# Patient Record
Sex: Male | Born: 1965 | Hispanic: Yes | Marital: Married | Smoking: Never smoker
Health system: Southern US, Community
[De-identification: ages and names within clinical notes are randomized; demographics above are authoritative.]

## PROBLEM LIST (undated history)

## (undated) DIAGNOSIS — A549 Gonococcal infection, unspecified: Secondary | ICD-10-CM

## (undated) DIAGNOSIS — N289 Disorder of kidney and ureter, unspecified: Secondary | ICD-10-CM

## (undated) DIAGNOSIS — E119 Type 2 diabetes mellitus without complications: Secondary | ICD-10-CM

## (undated) DIAGNOSIS — I10 Essential (primary) hypertension: Secondary | ICD-10-CM

## (undated) DIAGNOSIS — D6851 Activated protein C resistance: Secondary | ICD-10-CM

## (undated) DIAGNOSIS — D689 Coagulation defect, unspecified: Secondary | ICD-10-CM

## (undated) DIAGNOSIS — A6920 Lyme disease, unspecified: Secondary | ICD-10-CM

## (undated) DIAGNOSIS — E079 Disorder of thyroid, unspecified: Secondary | ICD-10-CM

## (undated) DIAGNOSIS — K219 Gastro-esophageal reflux disease without esophagitis: Secondary | ICD-10-CM

## (undated) DIAGNOSIS — I82409 Acute embolism and thrombosis of unspecified deep veins of unspecified lower extremity: Secondary | ICD-10-CM

## (undated) HISTORY — DX: Gastro-esophageal reflux disease without esophagitis: K21.9

## (undated) HISTORY — PX: CIRCUMCISION: SUR203

## (undated) HISTORY — DX: Essential (primary) hypertension: I10

## (undated) HISTORY — PX: TUNNELED VENOUS CATHETER PLACEMENT: SHX818

---

## 2010-06-17 ENCOUNTER — Other Ambulatory Visit: Payer: Self-pay | Admitting: Oncology

## 2010-06-17 ENCOUNTER — Encounter (HOSPITAL_BASED_OUTPATIENT_CLINIC_OR_DEPARTMENT_OTHER): Payer: BC Managed Care – PPO | Admitting: Oncology

## 2010-06-17 DIAGNOSIS — I749 Embolism and thrombosis of unspecified artery: Secondary | ICD-10-CM

## 2010-06-17 LAB — CBC WITH DIFFERENTIAL/PLATELET
BASO%: 1.2 % (ref 0.0–2.0)
HCT: 51.6 % — ABNORMAL HIGH (ref 38.4–49.9)
MCHC: 36 g/dL (ref 32.0–36.0)
MONO#: 0.7 10*3/uL (ref 0.1–0.9)
NEUT%: 51.6 % (ref 39.0–75.0)
RBC: 5.71 10*6/uL (ref 4.20–5.82)
WBC: 6.6 10*3/uL (ref 4.0–10.3)
lymph#: 2.1 10*3/uL (ref 0.9–3.3)

## 2010-06-21 LAB — HYPERCOAGULABLE PANEL, COMPREHENSIVE
AntiThromb III Func: 92 % (ref 76–126)
Anticardiolipin IgA: 6 APL U/mL (ref <22)
Anticardiolipin IgG: 19 GPL U/mL (ref <23)
Beta-2-Glycoprotein I IgA: 3 A Units (ref <20)
DRVVT: 70.2 secs — ABNORMAL HIGH (ref 36.2–44.3)
Lupus Anticoagulant: NOT DETECTED
PTTLA 4:1 Mix: 39.1 secs (ref 30.0–45.6)
Protein C Activity: 20 % — ABNORMAL LOW (ref 75–133)
Protein S Total: 59 % — ABNORMAL LOW (ref 60–150)

## 2010-06-21 LAB — COMPREHENSIVE METABOLIC PANEL
ALT: 35 U/L (ref 0–53)
CO2: 22 mEq/L (ref 19–32)
Calcium: 9.6 mg/dL (ref 8.4–10.5)
Chloride: 103 mEq/L (ref 96–112)
Creatinine, Ser: 1.48 mg/dL (ref 0.40–1.50)

## 2010-06-21 LAB — JAK-2 V617F

## 2010-11-06 ENCOUNTER — Encounter: Payer: Self-pay | Admitting: *Deleted

## 2010-11-06 ENCOUNTER — Emergency Department (HOSPITAL_BASED_OUTPATIENT_CLINIC_OR_DEPARTMENT_OTHER)
Admission: EM | Admit: 2010-11-06 | Discharge: 2010-11-06 | Disposition: A | Discharge status: Home / Self Care | Payer: BC Managed Care – PPO | Attending: Emergency Medicine | Admitting: Emergency Medicine

## 2010-11-06 ENCOUNTER — Emergency Department (INDEPENDENT_AMBULATORY_CARE_PROVIDER_SITE_OTHER): Payer: BC Managed Care – PPO

## 2010-11-06 DIAGNOSIS — Z79899 Other long term (current) drug therapy: Secondary | ICD-10-CM | POA: Insufficient documentation

## 2010-11-06 DIAGNOSIS — X58XXXA Exposure to other specified factors, initial encounter: Secondary | ICD-10-CM

## 2010-11-06 DIAGNOSIS — M7580 Other shoulder lesions, unspecified shoulder: Secondary | ICD-10-CM

## 2010-11-06 DIAGNOSIS — M67919 Unspecified disorder of synovium and tendon, unspecified shoulder: Secondary | ICD-10-CM | POA: Insufficient documentation

## 2010-11-06 DIAGNOSIS — M719 Bursopathy, unspecified: Secondary | ICD-10-CM | POA: Insufficient documentation

## 2010-11-06 DIAGNOSIS — S4980XA Other specified injuries of shoulder and upper arm, unspecified arm, initial encounter: Secondary | ICD-10-CM

## 2010-11-06 HISTORY — DX: Activated protein C resistance: D68.51

## 2010-11-06 HISTORY — DX: Lyme disease, unspecified: A69.20

## 2010-11-06 NOTE — ED Provider Notes (Signed)
Scribed for Geoffery Lyons, MD, the patient was seen in room MH03/MH03 . This chart was scribed by Ellie Lunch. This patient's care was started at 3:50 PM.   CSN: [id number] Arrival date & time: 11/06/2010  3:43 PM  Chief Complaint  Patient presents with  . Shoulder Injury    (Consider location/radiation/quality/duration/timing/severity/associated sxs/prior treatment) HPI Shane Brown is a 45 y.o. male who presents to the Emergency Department complaining of left shoulder pain. Pt reports injuring his left shoulder a month ago using a weight machine while working out. Pt reports limited ROM on his left shoulder. Reports aggravation of pain with external rotation of the shoulder and abduction. Pt further aggravated shoulder yesterday while walking dog when his dog pulled on the leash. Reports pain at 8/10 in severity. Pt reports normal grip strength and denies tingling sensation in his left digits. Denies h/o shoulder problems.   Past Medical History  Diagnosis Date  . Factor 5 Leiden mutation, heterozygous   . Lyme disease     Past Surgical History  Procedure Date  . Tunneled venous catheter placement   . Circumcision     History reviewed. No pertinent family history.  History  Substance Use Topics  . Smoking status: Never Smoker   . Smokeless tobacco: Not on file  . Alcohol Use: Yes    Review of Systems 10 Systems reviewed and are negative for acute change except as noted in the HPI.   Allergies  Iodides and Contrast media  Home Medications   Current Outpatient Rx  Name Route Sig Dispense Refill  . ASPIRIN 81 MG PO TABS Oral Take 81 mg by mouth daily.      . AZELAIC ACID 15 % EX GEL Topical Apply 1 application topically daily.      . FEBUXOSTAT 80 MG PO TABS Oral Take 1 tablet by mouth daily.      Marland Kitchen FEXOFENADINE HCL 180 MG PO TABS Oral Take 180 mg by mouth daily.      . MULTI-VITAMIN/MINERALS PO TABS Oral Take 1 tablet by mouth daily.      Marland Kitchen NAPROXEN SODIUM 220 MG  PO TABS Oral Take 220 mg by mouth once as needed. For pain     . TESTOSTERONE CYPIONATE 100 MG/ML IM OIL Intramuscular Inject 200 mg into the muscle once a week.      . THYROID 60 MG PO TABS Oral Take 60 mg by mouth daily.      Marland Kitchen VALSARTAN 80 MG PO TABS Oral Take 80 mg by mouth daily. For protein clearance     . VITAMIN D (ERGOCALCIFEROL) PO Oral Take 1 tablet by mouth daily.      Marland Kitchen ZOLPIDEM TARTRATE 10 MG PO TABS Oral Take 10 mg by mouth at bedtime as needed. For sleep      . CYANOCOBALAMIN 1000 MCG/ML IJ SOLN Intramuscular Inject 1,000 mcg into the muscle every 30 (thirty) days.        BP 159/97  Pulse 90  Temp(Src) 98 F (36.7 C) (Oral)  Resp 18  Ht 6' (1.829 m)  Wt 220 lb (99.791 kg)  BMI 29.84 kg/m2  SpO2 97%  Physical Exam  Nursing note and vitals reviewed. Constitutional: He is oriented to person, place, and time. He appears well-developed and well-nourished.  HENT:  Head: Normocephalic and atraumatic.  Eyes: Conjunctivae and EOM are normal.  Neck: Normal range of motion.  Cardiovascular: Normal rate, regular rhythm and normal heart sounds.   Pulmonary/Chest: Effort normal and breath  sounds normal.  Abdominal: Soft. There is no tenderness.  Musculoskeletal:       Tenderness lateral left shoulder. Neurovascularly intact. Distal extremities ok. Pain on abduction and external rotation.   Neurological: He is alert and oriented to person, place, and time.  Skin: Skin is warm and dry.  Psychiatric: He has a normal mood and affect.    ED Course  Procedures (including critical care time)  OTHER DATA REVIEWED: Nursing notes, vital signs, and past medical records reviewed.  LABS Dg Shoulder Left  11/06/2010  *RADIOLOGY REPORT*  Clinical Data: Injury to left shoulder  LEFT SHOULDER - 2+ VIEW  Comparison: None  Findings: There is no evidence of fracture or dislocation.  There is no evidence of arthropathy or other focal bone abnormality. Soft tissues are unremarkable.   IMPRESSION: Negative exam.  Original Report Authenticated By: Rosealee Albee, M.D.    DIAGNOSTIC STUDIES: Oxygen Saturation is 97% on room air, normal by my interpretation.    ED COURSE / COORDINATION OF CARE: 15:54 EDP at PT bedside. Discussed diagnostic possibilities including rotator cuff tendonitis. Discussed treatment plan including rest, antiinflammatories and time. If no improvement, f/u with PCP for possible MRI. Plan to discharge.   MDM: Xrays okay, appears to be rotator cuff injury.   SCRIBE ATTESTATION: I personally performed the services described in this documentation, which was scribed in my presence. The recorded information has been reviewed and considered.          3  Geoffery Lyons, MD 11/06/10 2047

## 2010-11-06 NOTE — ED Notes (Signed)
Pt states he originally ?injured his left shoulder working out. Did not get evaluated. Yesterday was walking dog and she jerked his left arm again. Now c/o pain to same.

## 2010-12-14 ENCOUNTER — Encounter (HOSPITAL_BASED_OUTPATIENT_CLINIC_OR_DEPARTMENT_OTHER): Payer: Self-pay

## 2010-12-14 ENCOUNTER — Emergency Department (HOSPITAL_BASED_OUTPATIENT_CLINIC_OR_DEPARTMENT_OTHER)
Admission: EM | Admit: 2010-12-14 | Discharge: 2010-12-14 | Disposition: A | Discharge status: Home / Self Care | Payer: BC Managed Care – PPO | Attending: Emergency Medicine | Admitting: Emergency Medicine

## 2010-12-14 DIAGNOSIS — R059 Cough, unspecified: Secondary | ICD-10-CM | POA: Insufficient documentation

## 2010-12-14 DIAGNOSIS — J3489 Other specified disorders of nose and nasal sinuses: Secondary | ICD-10-CM | POA: Insufficient documentation

## 2010-12-14 DIAGNOSIS — H9209 Otalgia, unspecified ear: Secondary | ICD-10-CM | POA: Insufficient documentation

## 2010-12-14 DIAGNOSIS — Z79899 Other long term (current) drug therapy: Secondary | ICD-10-CM | POA: Insufficient documentation

## 2010-12-14 DIAGNOSIS — R05 Cough: Secondary | ICD-10-CM | POA: Insufficient documentation

## 2010-12-14 DIAGNOSIS — E079 Disorder of thyroid, unspecified: Secondary | ICD-10-CM | POA: Insufficient documentation

## 2010-12-14 HISTORY — DX: Disorder of thyroid, unspecified: E07.9

## 2010-12-14 HISTORY — DX: Disorder of kidney and ureter, unspecified: N28.9

## 2010-12-14 HISTORY — DX: Coagulation defect, unspecified: D68.9

## 2010-12-14 MED ORDER — BENZONATATE 200 MG PO CAPS: 200.0000 mg | ORAL_CAPSULE | Freq: Three times a day (TID) | ORAL | Status: AC | PRN

## 2010-12-14 MED ORDER — BENZONATATE 100 MG PO CAPS
200.0000 mg | ORAL_CAPSULE | Freq: Once | ORAL | Status: AC
  Administered 2010-12-14: 200 mg via ORAL
  Filled 2010-12-14: qty 2

## 2010-12-14 MED ORDER — HYDROCOD POLST-CHLORPHEN POLST 10-8 MG/5ML PO LQCR: 5.0000 mL | Freq: Every evening | ORAL | Status: DC | PRN

## 2010-12-14 NOTE — ED Provider Notes (Signed)
History     CSN: [id number] Arrival date & time: 12/14/2010  8:52 AM   First MD Initiated Contact with Patient 12/14/10 [phone]      Chief Complaint  Patient presents with  . Cough  . Nasal Congestion  . 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  He says his pain is a 5/10.  He has non-productive cough and denies fevers, nausea, or vomiting.  He states that he has tried OTC meds without relief.  Nothing makes the cough better and it is worse laying down at night.  Patient has no radiation of his pain and says that his ear pain is throbbing.  He is mainly concerned about his cough today.  There are no other associated or modifying factors.  Past Medical History  Diagnosis Date  . Factor 5 Leiden mutation, heterozygous   . Lyme disease   . Clotting disorder   . Thyroid disease   . Renal disorder     Past Surgical History  Procedure Date  . Tunneled venous catheter placement   . Circumcision     History reviewed. No pertinent family history.  History  Substance Use Topics  . Smoking status: Never Smoker   . Smokeless tobacco: Not on file  . Alcohol Use: Yes     occasiobnal      Review of Systems  Constitutional: Negative.   HENT: Positive for ear pain, congestion, rhinorrhea and postnasal drip.   Eyes: Negative.   Respiratory: Positive for cough.   Cardiovascular: Negative.   Gastrointestinal: Negative.   Genitourinary: Negative.   Musculoskeletal: Negative.   Skin: Negative.   Neurological: Negative.   Hematological: Negative.   Psychiatric/Behavioral: Negative.   All other systems reviewed and are negative.    Allergies  Iodides and Contrast media  Home Medications   Current Outpatient Rx  Name Route Sig Dispense Refill  . ALPRAZOLAM 1 MG PO TABS Oral  Take 1 mg by mouth daily.      . ASPIRIN 81 MG PO TABS Oral Take 81 mg by mouth daily.      . AZELAIC ACID 15 % EX GEL Topical Apply 1 application topically daily.      Marland Kitchen BENZONATATE 200 MG PO CAPS Oral Take 1 capsule (200 mg total) by mouth 3 (three) times daily as needed for cough. 20 capsule 0  . HYDROCOD POLST-CHLORPHEN POLST 10-8 MG/5ML PO LQCR Oral Take 5 mLs by mouth at bedtime as needed. 140 mL 0  . CYANOCOBALAMIN 1000 MCG/ML IJ SOLN Intramuscular Inject 1,000 mcg into the muscle every 30 (thirty) days.      . FEBUXOSTAT 80 MG PO TABS Oral Take 1 tablet by mouth daily.      Marland Kitchen FEXOFENADINE HCL 180 MG PO TABS Oral Take 180 mg by mouth daily.      . MULTI-VITAMIN/MINERALS PO TABS Oral Take 1 tablet by mouth daily.      Marland Kitchen NAPROXEN SODIUM 220 MG PO TABS Oral Take 220 mg by mouth once as needed. For pain     . TESTOSTERONE CYPIONATE 100 MG/ML IM OIL Intramuscular Inject 200 mg into the muscle once a week.      . THYROID 60 MG PO TABS Oral Take 60 mg by mouth daily.      Marland Kitchen VALSARTAN 80 MG PO TABS Oral Take 80 mg by  mouth daily. For protein clearance     . VITAMIN D (ERGOCALCIFEROL) PO Oral Take 1 tablet by mouth daily.      Marland Kitchen ZOLPIDEM TARTRATE 10 MG PO TABS Oral Take 10 mg by mouth at bedtime as needed. For sleep        BP 120/77  Pulse 82  Temp(Src) 98.1 F (36.7 C) (Oral)  Ht 6' (1.829 m)  Wt 210 lb (95.255 kg)  BMI 28.48 kg/m2  SpO2 97%  Physical Exam  Nursing note and vitals reviewed. Constitutional: He is oriented to person, place, and time. He appears well-developed and well-nourished. No distress.  HENT:  Head: Normocephalic and atraumatic.  Right Ear: Tympanic membrane normal.  Left Ear: Tympanic membrane normal.  Nose: Mucosal edema present.  Mouth/Throat: Posterior oropharyngeal erythema present. No oropharyngeal exudate or posterior oropharyngeal edema.  Eyes: Conjunctivae and EOM are normal. Pupils are equal, round, and reactive to light.  Neck: Normal range of  motion.  Cardiovascular: Normal rate, regular rhythm, normal heart sounds and intact distal pulses.  Exam reveals no gallop and no friction rub.   No murmur heard. Pulmonary/Chest: Effort normal and breath sounds normal. No respiratory distress. He has no wheezes. He has no rales.  Abdominal: Soft. Bowel sounds are normal. He exhibits no distension. There is no tenderness. There is no rebound and no guarding.  Musculoskeletal: Normal range of motion.  Neurological: He is alert and oriented to person, place, and time. No cranial nerve deficit. He exhibits normal muscle tone. Coordination normal.  Skin: Skin is warm and dry. No rash noted.  Psychiatric: He has a normal mood and affect.    ED Course  Procedures (including critical care time)  Labs Reviewed - No data to display No results found.   1. Cough       MDM  Patient was evaluated by myself and reported that he was mainly concerned about his cough.  He is a respiratory therapist and agreed with me that the likelihood of this being a pneumonia was very low.  We also agreed that no further imaging was necessary today.  Patient just wants to get rid of his cough so he can sleep and work.  He was given Jerilynn Som here and a prescription for both this and tussionex.  He was discharged home in good condition and was told he is welcome to return if he develops fevers, worsening symptoms, or other emergent concerns.        Cyndra Numbers, MD 12/14/10 2000

## 2010-12-14 NOTE — ED Notes (Signed)
Pt reports nasal and chest congestion, productive cough and ear pain unrelieved after taking Mucinex.

## 2010-12-14 NOTE — ED Notes (Signed)
Previous order for ice application was documented on incorrect pt.

## 2010-12-14 NOTE — ED Notes (Signed)
MD at bedside. 

## 2011-06-23 ENCOUNTER — Encounter: Payer: Self-pay | Admitting: Oncology

## 2011-06-23 NOTE — Progress Notes (Signed)
Received bankruptcy information from [facility]. Faxed to [facility] & and Amy @ [employer] and also faxed to [employer] (hosp Pt. Acct). Forward to Medical Record to attach to patient's file.

## 2011-10-27 ENCOUNTER — Emergency Department (HOSPITAL_COMMUNITY): Payer: BC Managed Care – PPO

## 2011-10-27 ENCOUNTER — Emergency Department (HOSPITAL_COMMUNITY)
Admission: EM | Admit: 2011-10-27 | Discharge: 2011-10-27 | Disposition: A | Discharge status: Home / Self Care | Payer: BC Managed Care – PPO | Attending: Emergency Medicine | Admitting: Emergency Medicine

## 2011-10-27 ENCOUNTER — Encounter (HOSPITAL_COMMUNITY): Payer: Self-pay | Admitting: Family Medicine

## 2011-10-27 DIAGNOSIS — Z7982 Long term (current) use of aspirin: Secondary | ICD-10-CM | POA: Insufficient documentation

## 2011-10-27 DIAGNOSIS — I2699 Other pulmonary embolism without acute cor pulmonale: Secondary | ICD-10-CM | POA: Insufficient documentation

## 2011-10-27 DIAGNOSIS — E079 Disorder of thyroid, unspecified: Secondary | ICD-10-CM | POA: Insufficient documentation

## 2011-10-27 DIAGNOSIS — Z79899 Other long term (current) drug therapy: Secondary | ICD-10-CM | POA: Insufficient documentation

## 2011-10-27 DIAGNOSIS — M549 Dorsalgia, unspecified: Secondary | ICD-10-CM | POA: Insufficient documentation

## 2011-10-27 LAB — CBC WITH DIFFERENTIAL/PLATELET
Basophils Absolute: 0.1 10*3/uL (ref 0.0–0.1)
Basophils Relative: 1 % (ref 0–1)
Eosinophils Absolute: 0.3 10*3/uL (ref 0.0–0.7)
Eosinophils Relative: 4 % (ref 0–5)
HCT: 48.2 % (ref 39.0–52.0)
Lymphocytes Relative: 25 % (ref 12–46)
MCH: 32.2 pg (ref 26.0–34.0)
MCHC: 36.5 g/dL — ABNORMAL HIGH (ref 30.0–36.0)
MCV: 88.3 fL (ref 78.0–100.0)
Monocytes Absolute: 1.1 10*3/uL — ABNORMAL HIGH (ref 0.1–1.0)
Platelets: 337 10*3/uL (ref 150–400)
RDW: 12.5 % (ref 11.5–15.5)

## 2011-10-27 LAB — COMPREHENSIVE METABOLIC PANEL
AST: 20 U/L (ref 0–37)
CO2: 25 mEq/L (ref 19–32)
Calcium: 9.9 mg/dL (ref 8.4–10.5)
Creatinine, Ser: 1.21 mg/dL (ref 0.50–1.35)
GFR calc non Af Amer: 71 mL/min — ABNORMAL LOW (ref >90)
Sodium: 138 mEq/L (ref 135–145)
Total Protein: 7.4 g/dL (ref 6.0–8.3)

## 2011-10-27 LAB — POCT I-STAT TROPONIN I

## 2011-10-27 MED ORDER — TECHNETIUM TO 99M ALBUMIN AGGREGATED
3.0000 | Freq: Once | INTRAVENOUS | Status: AC | PRN
  Administered 2011-10-27: 3 via INTRAVENOUS

## 2011-10-27 MED ORDER — ENOXAPARIN SODIUM 60 MG/0.6ML ~~LOC~~ SOLN
100.0000 mg | Freq: Once | SUBCUTANEOUS | Status: AC
  Administered 2011-10-27: 100 mg via SUBCUTANEOUS
  Filled 2011-10-27: qty 1.2

## 2011-10-27 MED ORDER — ENOXAPARIN SODIUM 60 MG/0.6ML ~~LOC~~ SOLN: 1.0000 mg/kg | Freq: Every day | SUBCUTANEOUS | Status: DC

## 2011-10-27 MED ORDER — WARFARIN SODIUM 5 MG PO TABS: 5.0000 mg | ORAL_TABLET | Freq: Every day | ORAL | Status: DC

## 2011-10-27 MED ORDER — XENON XE 133 GAS
20.0000 | GAS_FOR_INHALATION | Freq: Once | RESPIRATORY_TRACT | Status: AC | PRN
  Administered 2011-10-27: 20 via RESPIRATORY_TRACT

## 2011-10-27 NOTE — ED Provider Notes (Signed)
History     CSN: [id number]  Arrival date & time 10/27/11  1200   First MD Initiated Contact with Patient 10/27/11 1729      Chief Complaint  Patient presents with  . Back Pain  . Shortness of Breath    (Consider location/radiation/quality/duration/timing/severity/associated sxs/prior treatment) HPI  The patient presents with left-sided chest pain.  Symptoms began yesterday, insidiously.  Since onset he has had pain focally about the left inferior anterior axilla.  The pain is present with anything more than shallow inspiration, worse with deep inspiration, pleuritic in nature.  The patient describes the pain as being "the same" as when he had a PE. He denies fever, cough, significant dyspnea, lower extremity edema. The patient is not currently on warfarin.  He has factor V Leiden deficiency.  Past Medical History  Diagnosis Date  . Factor 5 Leiden mutation, heterozygous   . Lyme disease   . Clotting disorder   . Thyroid disease   . Renal disorder     Past Surgical History  Procedure Date  . Tunneled venous catheter placement   . Circumcision     History reviewed. No pertinent family history.  History  Substance Use Topics  . Smoking status: Never Smoker   . Smokeless tobacco: Not on file  . Alcohol Use: Yes     occasiobnal      Review of Systems  Constitutional:       Per HPI, otherwise negative  HENT:       Per HPI, otherwise negative  Eyes: Negative.   Respiratory:       Per HPI, otherwise negative  Cardiovascular:       Per HPI, otherwise negative  Gastrointestinal: Negative for vomiting.  Genitourinary: Negative.   Musculoskeletal:       Per HPI, otherwise negative  Skin: Negative.   Neurological: Negative for syncope, weakness and light-headedness.    Allergies  Iodides; Contrast media; and Wheat bran  Home Medications   Current Outpatient Rx  Name Route Sig Dispense Refill  . ALBUTEROL SULFATE HFA 108 (90 BASE) MCG/ACT IN AERS  Inhalation Inhale 2 puffs into the lungs every 6 (six) hours as needed. For wheezing.    Marland Kitchen ALPRAZOLAM 1 MG PO TABS Oral Take 1 mg by mouth 2 (two) times daily as needed. For anxiety.    . ASPIRIN 325 MG PO TABS Oral Take 325 mg by mouth daily.    . AZELAIC ACID 15 % EX GEL Topical Apply 1 application topically daily as needed. For rosacea.    Marland Kitchen CYANOCOBALAMIN 1000 MCG/ML IJ SOLN Intramuscular Inject 1,000 mcg into the muscle every 30 (thirty) days. Last dose 10/01/2011    . LEVOTHYROXINE SODIUM 50 MCG PO TABS Oral Take 50 mcg by mouth daily.    . MULTI-VITAMIN/MINERALS PO TABS Oral Take 1 tablet by mouth daily.      Marland Kitchen NAPROXEN SODIUM 220 MG PO TABS Oral Take 220 mg by mouth once as needed. For pain     . TESTOSTERONE CYPIONATE 100 MG/ML IM OIL Intramuscular Inject 200 mg into the muscle once a week. Inject on Sundays.    . THYROID 120 MG PO TABS Oral Take 120 mg by mouth daily.    Marland Kitchen ZOLPIDEM TARTRATE 10 MG PO TABS Oral Take 10 mg by mouth at bedtime as needed. For sleep        BP 123/84  Pulse 84  Temp 98.1 F (36.7 C) (Oral)  Resp 18  SpO2 97%  Physical Exam  Nursing note and vitals reviewed. Constitutional: He is oriented to person, place, and time. He appears well-developed. No distress.  HENT:  Head: Normocephalic and atraumatic.  Eyes: Conjunctivae normal and EOM are normal.  Cardiovascular: Normal rate and regular rhythm.   Pulmonary/Chest: Effort normal. No stridor. No respiratory distress.  Abdominal: He exhibits no distension.  Musculoskeletal: He exhibits no edema.  Neurological: He is alert and oriented to person, place, and time.  Skin: Skin is warm and dry.  Psychiatric: He has a normal mood and affect.    ED Course  Procedures (including critical care time)  Labs Reviewed  CBC WITH DIFFERENTIAL - Abnormal; Notable for the following:    Hemoglobin 17.6 (*)     MCHC 36.5 (*)     Monocytes Absolute 1.1 (*)     All other components within normal limits    COMPREHENSIVE METABOLIC PANEL - Abnormal; Notable for the following:    GFR calc non Af Amer 71 (*)     GFR calc Af Amer 83 (*)     All other components within normal limits  POCT I-STAT TROPONIN I   Dg Chest 2 View  10/27/2011  *RADIOLOGY REPORT*  Clinical Data: Shortness of breath, back pain.  CHEST - 2 VIEW  Comparison: None.  Findings: Cardiomediastinal contours are within normal range.  The lungs are predominately clear.  No pleural effusion or pneumothorax.  No acute osseous finding.  IMPRESSION: No radiographic evidence of acute cardiopulmonary process.   Original Report Authenticated By: Waneta Martins, M.D.      No diagnosis found.   O2:  100%ra, normal   Date: 10/27/2011  Rate: 86  Rhythm: normal sinus rhythm  QRS Axis: normal  Intervals: normal  ST/T Wave abnormalities: normal  Conduction Disutrbances: none  Narrative Interpretation: unremarkable  Given the patient's dye allergy and his poor renal function, V/Q scan was ordered.  2030: patient notified of results.  He defers admission and requests d/c w lovenox and coumadin.   MDM  This generally well-appearing male with factor V Leiden deficiency, no longer anticoagulated now presents with new chest pain.  Given the patient's description of pain is similar to that his during previous pulmonary embolism, estimated suspicion of this and 20.  A VQ scan was consistent with this finding.  I discussed the indication for admission, anticoagulation with the patient.  The patient was adamant about not staying.  We discussed return precautions, given his capacity to make this decision he was discharged with return precautions, initial doses of Lovenox, Coumadin prescribed.   Gerhard Munch, MD 10/27/11 2348

## 2011-10-27 NOTE — ED Notes (Signed)
Patient transported to CT 

## 2011-10-27 NOTE — ED Notes (Addendum)
Per pt sts he is having pain in his left upper back and more when taking a deep breath. sts coughing up small amounts of blood and SOB. Pt sts hx of PE and was taken off blood thinner in March. sts the pain radiated into left shoulder

## 2011-10-27 NOTE — ED Notes (Signed)
Pt states that he has a history of a PE and thinks that he is having one now. Pt's breathing is shallow. Lungs are clear.

## 2011-10-27 NOTE — ED Notes (Signed)
i-stat troponin results=.00 

## 2012-03-16 ENCOUNTER — Other Ambulatory Visit: Payer: Self-pay

## 2012-12-05 ENCOUNTER — Other Ambulatory Visit: Payer: Self-pay

## 2013-08-14 DIAGNOSIS — I872 Venous insufficiency (chronic) (peripheral): Secondary | ICD-10-CM | POA: Insufficient documentation

## 2013-08-14 DIAGNOSIS — I8390 Asymptomatic varicose veins of unspecified lower extremity: Secondary | ICD-10-CM | POA: Insufficient documentation

## 2013-08-14 DIAGNOSIS — D6851 Activated protein C resistance: Secondary | ICD-10-CM | POA: Insufficient documentation

## 2013-11-06 DIAGNOSIS — I87009 Postthrombotic syndrome without complications of unspecified extremity: Secondary | ICD-10-CM | POA: Insufficient documentation

## 2013-12-08 DIAGNOSIS — I82409 Acute embolism and thrombosis of unspecified deep veins of unspecified lower extremity: Secondary | ICD-10-CM | POA: Insufficient documentation

## 2015-01-31 DIAGNOSIS — A549 Gonococcal infection, unspecified: Secondary | ICD-10-CM

## 2015-01-31 HISTORY — DX: Gonococcal infection, unspecified: A54.9

## 2015-06-07 DIAGNOSIS — M722 Plantar fascial fibromatosis: Secondary | ICD-10-CM | POA: Insufficient documentation

## 2015-06-07 DIAGNOSIS — M6700 Short Achilles tendon (acquired), unspecified ankle: Secondary | ICD-10-CM | POA: Insufficient documentation

## 2015-06-23 ENCOUNTER — Ambulatory Visit (INDEPENDENT_AMBULATORY_CARE_PROVIDER_SITE_OTHER): Payer: BLUE CROSS/BLUE SHIELD

## 2015-06-23 ENCOUNTER — Ambulatory Visit (INDEPENDENT_AMBULATORY_CARE_PROVIDER_SITE_OTHER): Payer: BLUE CROSS/BLUE SHIELD | Admitting: Sports Medicine

## 2015-06-23 ENCOUNTER — Encounter: Payer: Self-pay | Admitting: Sports Medicine

## 2015-06-23 DIAGNOSIS — M79672 Pain in left foot: Secondary | ICD-10-CM

## 2015-06-23 DIAGNOSIS — M722 Plantar fascial fibromatosis: Secondary | ICD-10-CM

## 2015-06-23 NOTE — Progress Notes (Signed)
Patient ID: Shane Brown, male   DOB: 11/05/1965, 50 y.o.   MRN: [id number] Subjective: Shane Brown is a 50 y.o. male patient presents to office with complaint of heel pain on the left. 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 Kaylyn Layerilles with no improvement states that he has had total of 6 injections of Steroid, the last one given a month ago. Reports that he has tried several conservative treatments, stretching, Biofreeze, anti-inflammatories by mouth, steroids by mouth, and is currently in a boot since March with minimal relief. Denies any other pedal complaints.   Patient Active Problem List   Diagnosis Date Noted  . 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 Plantar fasciitis 06/07/2015  . Deep vein thrombosis (DVT) of lower extremity (HCC) 12/08/2013  . Post-phlebitic syndrome 11/06/2013  . Heterozygous factor V Leiden mutation (HCC) 08/14/2013  . 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 Chronic venous insufficiency 08/14/2013    Current Outpatient Prescriptions on File Prior to Visit  Medication Sig Dispense Refill  . albuterol (PROVENTIL HFA;VENTOLIN HFA) 108 (90 BASE) MCG/ACT inhaler Inhale 2 puffs into the lungs every 6 (six) hours as needed. For wheezing.    Marland Kitchen. ALPRAZolam (XANAX) 1 MG tablet Take 1 mg by mouth 2 (two) times daily as needed. For anxiety.    Marland Kitchen. aspirin 325 MG tablet Take 325 mg by mouth daily.    . Azelaic Acid (FINACEA) 15 % cream Apply 1 application topically daily as needed. For rosacea.    . cyanocobalamin (,VITAMIN B-12,) 1000 MCG/ML injection Inject 1,000 mcg into the muscle every 30 (thirty) days. Last dose 10/01/2011    . enoxaparin (LOVENOX) 60 MG/0.6ML injection Inject 0.95 mLs (95 mg total) into the skin daily. 5 Syringe 0  . levothyroxine (SYNTHROID, LEVOTHROID) 50 MCG tablet Take 50 mcg by mouth daily.    . Multiple Vitamins-Minerals (MULTIVITAMIN WITH MINERALS) tablet Take 1 tablet by mouth daily.      .  naproxen sodium (ANAPROX) 220 MG tablet Take 220 mg by mouth once as needed. For pain     . testosterone cypionate (DEPOTESTOTERONE CYPIONATE) 100 MG/ML injection Inject 200 mg into the muscle once a week. Inject on Sundays.    Marland Kitchen. thyroid (ARMOUR) 120 MG tablet Take 120 mg by mouth daily.    Marland Kitchen. warfarin (COUMADIN) 5 MG tablet Take 1 tablet (5 mg total) by mouth daily. 10 tablet 0  . zolpidem (AMBIEN) 10 MG tablet Take 10 mg by mouth at bedtime as needed. For sleep       No current facility-administered medications on file prior to visit.    Allergies  Allergen Reactions  . Contrast Media [Iodinated Diagnostic Agents] Hives and Anaphylaxis  . Iodides Anaphylaxis and Hives  . 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 Wheat Bran Diarrhea    Objective: Physical Exam General: The patient is alert and oriented x3 in no acute distress.  Dermatology: Skin is warm, dry and supple bilateral lower extremities. Nails 1-10 are normal. There is no erythema, minimal edema, no eccymosis, no open lesions present. Integument is otherwise unremarkable.  Vascular: Dorsalis Pedis pulse and Posterior Tibial pulse are 2/4 bilateral. Capillary fill time is immediate to all digits. Varicosities bilateral with venous hyperpigmentation.  Neurological: Grossly intact to light touch with an achilles reflex of +2/5 and a negative Tinel's sign bilateral.  Musculoskeletal: Tenderness to palpation at the medial calcaneal tubercale  and through the insertion of the plantar fascia on the left foot. No pain with compression of calcaneus bilateral. No pain with tuning fork to calcaneus bilateral. No pain with calf compression bilateral. There is decreased Ankle joint range of motion bilateral. All other joints range of motion within normal limits bilateral. Strength 5/5 in all groups bilateral.   Gait: Unassisted, Antalgic avoid weight on left/right heel  Xray, Left foot:  Normal osseous  mineralization. Joint spaces preserved. No fracture/dislocation/boney destruction. Calcaneal spur present with mild thickening of plantar fascia. No other soft tissue abnormalities or radiopaque foreign bodies.   Assessment and Plan: Problem List Items Addressed This Visit    None    Visit Diagnoses    Left foot pain    -  Primary    Relevant Orders    DG Foot 2 Views Left    Plantar fasciitis of left foot        Chronic       -Complete examination performed.  -Xrays reviewed -Discussed with patient in detail the condition of plantar fasciitis, how this occurs and general treatment options. Explained both conservative and surgical treatments -Patient opt for surgical management. Consent obtained for left endoscopic plantar fasciotomy. Pre and Post op course explained. Risks, benefits, alternatives explained. No guarantees given or implied. Surgical booking slip submitted and provided patient with Surgical packet and info for [facility]. Patient will require medical clearance from primary care physician and hematologist -Recommend at minimum 1 week from work -Continue with current cam walker on left to use postoperatively -Continue with daily stretching exercises. -Recommend patient to ice affected area 1-2x daily -Continue with compression garments -Patient to return to office after surgery for follow up or sooner if problems or questions arise  Asencion Islam, DPM

## 2015-06-23 NOTE — Patient Instructions (Signed)
Pre-Operative Instructions  Congratulations, you have decided to take an important step to improving your quality of life.  You can be assured that the doctors of [facility] will be with you every step of the way.   Plan to be at the surgery center/hospital at least 1 (one) hour prior to your scheduled time unless otherwise directed by the surgical center/hospital staff.  You must have a responsible adult accompany you, remain during the surgery and drive you home.  Make sure you have directions to the surgical center/hospital and know how to get there on time.  For hospital based surgery you will need to obtain a history and physical form from your family physician within 1 month prior to the date of surgery- we will give you a form for you primary physician.   We make every effort to accommodate the date you request for surgery.  There are however, times where surgery dates or times have to be moved.  We will contact you as soon as possible if a change in schedule is required.    No Aspirin/Ibuprofen for one week before surgery.  If you are on aspirin, any non-steroidal anti-inflammatory medications (Mobic, Aleve, Ibuprofen) you should stop taking it 7 days prior to your surgery.  You make take Tylenol  For pain prior to surgery.   Medications- If you are taking daily heart and blood pressure medications, seizure, reflux, allergy, asthma, anxiety, pain or diabetes medications, make sure the surgery center/hospital is aware before the day of surgery so they may notify you which medications to take or avoid the day of surgery.  No food or drink after midnight the night before surgery unless directed otherwise by surgical center/hospital staff.  No alcoholic beverages 24 hours prior to surgery.  No smoking 24 hours prior to or 24 hours after surgery.  Wear loose pants or shorts- loose enough to fit over bandages, boots, and casts.  No slip on shoes, sneakers are best.  Bring your boot  with you to the surgery center/hospital.  Also bring crutches or a walker if your physician has prescribed it for you.  If you do not have this equipment, it will be provided for you after surgery.  If you have not been contracted by the surgery center/hospital by the day before your surgery, call to confirm the date and time of your surgery.  Leave-time from work may vary depending on the type of surgery you have.  Appropriate arrangements should be made prior to surgery with your employer.  Prescriptions will be provided immediately following surgery by your doctor.  Have these filled as soon as possible after surgery and take the medication as directed.  Remove nail polish on the operative foot.  Wash the night before surgery.  The night before surgery wash the foot and leg well with the antibacterial soap provided and water paying special attention to beneath the toenails and in between the toes.  Rinse thoroughly with water and dry well with a towel.  Perform this wash unless told not to do so by your physician.  Enclosed: 1 Ice pack (please put in freezer the night before surgery)   1 Hibiclens skin cleaner   Pre-op Instructions  If you have any questions regarding the instructions, do not hesitate to call our office.  [place]: [address] [place], [place] [address] [phone]  [place]: [address] [place], [place] [address] [phone]  [place]: [address]  [place], [place] [phone]   Dr.  Cristie Hem DPM, Dr. Ovid Curd DPM, Dr. Ardelle Anton DPM, Dr. Asencion Islam DPM   Endoscopic Plantar Fasciotomy On the underside of the foot and heel is a tight band of tissue called the plantar fascia. Sometimes the plantar fascia become inflamed (the body's way of reacting to injury, overuse or infection) which produces pain. The condition is known as plantar fasciitis.  One way to treat plantar fasciitis is through an endoscopic plantar fasciotomy. This is  surgery to reduce the tension on the plantar fascia. However, it is a minimally invasive surgery because there will be no large incision. Instead, the surgeon inserts a thin, flexible tube through a small (1/16th of an inch (1.59 mm)) cut in your skin. The surgeon can examine and release the fascia through this tube. Recovery from an endoscopic fasciotomy is usually less painful and faster than from open surgery. LET YOUR CAREGIVER KNOW ABOUT:  Any allergies.  All medications you are taking, including:  Herbs, eyedrops, over-the-counter medications and creams.  Blood thinners (anticoagulants) or other drugs that could affect blood clotting.  Use of steroids (by mouth or as creams).  Previous problems with anesthetics, including local anesthetics.  Possibility of pregnancy, if this applies.  Any history of blood clots.  Any history of bleeding or other blood problems.  Previous surgery.  Smoking history.  Other health problems.  Family history of anesthetic problems RISKS AND COMPLICATIONS  Short-term possibilities include:  Excessive bleeding.  Pain.  Loss of feeling (numbness) at the site of the incision.  Hematoma, a pooling of blood in the wound.  Infection.  Slow resolution of the symptoms. Longer-term possibilities include:  Scarring.  A return of the condition that led to fasciotomy.  Damage to nerves in the area.  Weakness in your foot.  Need for additional surgery. BEFORE THE PROCEDURE  Ask whether you need to get shoes that will support your heel and arch while you recover.  7 to 10 days before the surgery, stop using aspirin and non-steroidal anti-inflammatory drugs (NSAIDs) for pain relief. This includes prescription drugs and over-the-counter drugs such as ibuprofen and naproxen.  If you take blood-thinners, ask your healthcare provider when you should stop taking them.  Do not eat or drink for about 8 hours before your surgery.  You might be  asked to shower or wash with a special antibacterial soap before the procedure.  Arrive 1-2 hours before the surgery, or whenever your surgeon recommends. This will give you time to check in and fill out any needed paperwork.  If your surgery is an outpatient procedure, you will be able to go home the same day. Make arrangements in advance for someone to drive you home. PROCEDURE  You may be given general anesthesia (you will be asleep), regional anesthesia (your leg will be numbed) or local anesthesia (just the area around the fascia will be numbed). With regional and local anesthesia you will be given medication to make you groggy but awake during the procedure.  Your foot will be cleaned and sterilized.  The surgeon will make a cut (incision) on the side of your heel. Then a thin tube that contains a tiny camera will be inserted into the space. The camera makes it possible for the surgeon to see what is happening inside your foot.  The surgeon will work through this tube to release the fascia.  The tube will be removed, and dressing will be applied to the incisions. AFTER THE PROCEDURE After the procedure, you  will be taken to another room to recover. People usually go home the same day. Before leaving, make sure you have detailed instructions on how to care for the incision. Also, you may be given crutches and shown how to use them. Ask your surgeon whether physical therapy will be needed.  HOME CARE INSTRUCTIONS   Take any prescription medication for pain and/or nausea that your surgeon prescribes. Follow the directions carefully and take all of the medication.  Ask your surgeon whether you can take over-the-counter medicines for pain, discomfort or fever. Do not take aspirin unless your healthcare provider says to. Aspirin can increase the chances of bleeding.  You may need to put ice on your foot for 10 to 15 minutes each day for several days.  While you are resting, keep your foot  elevated above the level of your heart.  Do not get the incisions wet for the first few days after surgery (or until the surgeon tells you it is OK).  Avoid standing or walking for long periods. Your healthcare provider will tell you when you are clear to resume normal activity. If your job requires a lot of standing or walking, ask to be assigned to a less active position for about 8 weeks.  When you are up and about, wear shoes with a supportive heel and arch support. Soft running shoes may be recommended for the first two weeks of recovery. SEEK MEDICAL CARE IF:   The wound becomes red or swollen.  The wound leaks fluid or blood.  Your pain increases.  You become nauseous or vomit for more than two days after the surgery.  You have pain or difficulty moving your foot.  You develop a fever of more than 100.5 F (38.1 C). SEEK IMMEDIATE MEDICAL CARE IF:   Your leg or foot starts to swell.  You develop a fever of 102.0 F (38.9 C) or higher.   This information is not intended to replace advice given to you by your health care provider. Make sure you discuss any questions you have with your health care provider.   Document Released: 11/13/2008 Document Revised: 04/10/2011 Document Reviewed: 09/29/2014 Elsevier Interactive Patient Education [employer]

## 2015-06-24 ENCOUNTER — Telehealth: Payer: Self-pay | Admitting: *Deleted

## 2015-06-24 NOTE — Telephone Encounter (Signed)
"  I saw Dr. Marylene LandStover yesterday in the [place] office.  She told me to call you to get a date for surgery."  Dr. Marylene LandStover does surgery on Mondays.  Do you have a date in mind?  "I'd like to do it on June 19."  That date is available.  You can go ahead and register with the surgical center.  Someone from the surgical center will call you with the arrival time the Friday before.  Remember not to eat or drink anything after midnight.  "Okay, thank you so much."

## 2015-07-08 ENCOUNTER — Telehealth: Payer: Self-pay | Admitting: *Deleted

## 2015-07-08 NOTE — Telephone Encounter (Signed)
Hold Eliquis 3 days prior to surgery -Dr. Marylene LandStover

## 2015-07-08 NOTE — Telephone Encounter (Addendum)
Pt states he is on Eliquis, and was wondering if he needed to be off of it for 3 or 5 or 7 days prior to the surgery on 07/19/2015.  07/20/2015-Pt states he needs a note for 4-6 weeks out of work post op, faxed to [facility] [phone]. [facility]. 07/21/2015-Pt states he lost his post op instructions and wanted to know when he could shower.  I told pt he could shower or birdbath now, but must keep surgical dressing and boot dry, and not to do either without someone at home with him.  Pt states understanding.

## 2015-07-12 ENCOUNTER — Encounter: Payer: Self-pay | Admitting: *Deleted

## 2015-07-12 ENCOUNTER — Telehealth: Payer: Self-pay | Admitting: *Deleted

## 2015-07-12 NOTE — Telephone Encounter (Signed)
I'm calling from Dr. Wynema BirchStover's office.  We are trying to get medical clearance for your surgery.  What's your Hematologist's name?  "His name is Kathrin RuddySteven Moll.  He's with [facility].  I've already called and left him a message, asking when should I stop my Eliquis.  I have not heard anything back yet."  Okay, thank you.

## 2015-07-12 NOTE — Telephone Encounter (Signed)
"  I spoke to you a little bit ago about getting Medical clearance for me.  I see the letter that Dr. Marylene LandStover sent to my primary care physician has come across MyChart.  It says I'm taking Warfarin.  I'm not on Warfarin.  I'm on Eliquis.  I have not been on Warfarin for a few months now.  Give me a call when you get a chance."

## 2015-07-12 NOTE — Telephone Encounter (Signed)
I patient a message that changes had been made to medical clearance letters to his doctors.  Warfarin was changed to Eliquis on the clearance letters.

## 2015-07-14 NOTE — Telephone Encounter (Signed)
Thanks. I'm fine with him starting back on Eliquis one day after the procedure -Dr. Marylene LandStover

## 2015-07-14 NOTE — Telephone Encounter (Signed)
Dr. Isaiah SergeMoll stated patient can skip 4 doses of Eliquis before procedure.  When to start depends on what the bleeding risk is.  Patient is to ask the surgeon when full dose anticoagulation can be started.  He stated he prefers that patient start Eliquis 5 mg twice a day one day after the procedure.  I called the patient to inform him.  "They had already called and informed me the same day that you contacted them.  He said he wants me to start it back up the day after surgery if Dr. Marylene LandStover is okay with it."  Yes, that is correct.  "Okay thanks for calling.  I guess I'll see you next week."

## 2015-07-16 ENCOUNTER — Telehealth: Payer: Self-pay | Admitting: *Deleted

## 2015-07-16 NOTE — Telephone Encounter (Signed)
"  I'm scheduled for surgery on Monday.  I just spoke to the anesthesiologist and he said I was going to get a block behind the knee.  Am I going to be put to sleep as well because I don't want to hear anything?"  Yes, you will be put to sleep.  "Good because I don't want to just be in a twilight zone.  I want some Propofol or something.  Thank you very much."

## 2015-07-19 ENCOUNTER — Encounter: Payer: Self-pay | Admitting: Sports Medicine

## 2015-07-19 DIAGNOSIS — M7732 Calcaneal spur, left foot: Secondary | ICD-10-CM | POA: Diagnosis not present

## 2015-07-19 DIAGNOSIS — M722 Plantar fascial fibromatosis: Secondary | ICD-10-CM | POA: Diagnosis not present

## 2015-07-20 ENCOUNTER — Telehealth: Payer: Self-pay | Admitting: Sports Medicine

## 2015-07-20 ENCOUNTER — Encounter: Payer: Self-pay | Admitting: *Deleted

## 2015-07-20 NOTE — Telephone Encounter (Signed)
Post op phone call made to patient. Patient reports that he had some pain that is relieved by pain medication. Patient otherwise denies any other symptoms. States that he will be taking mom to [place] to visit relative since she is under hospice care. I advised patient to keep post op appointments to prevent post op complications. Patient expressed understanding. Patient will also provide work information for work note- Recommend no work for 4 weeks 08-18-15. -Dr. Marylene LandStover

## 2015-07-28 ENCOUNTER — Ambulatory Visit (INDEPENDENT_AMBULATORY_CARE_PROVIDER_SITE_OTHER): Payer: BLUE CROSS/BLUE SHIELD | Admitting: Sports Medicine

## 2015-07-28 ENCOUNTER — Encounter: Payer: Self-pay | Admitting: Sports Medicine

## 2015-07-28 ENCOUNTER — Ambulatory Visit (INDEPENDENT_AMBULATORY_CARE_PROVIDER_SITE_OTHER): Payer: BLUE CROSS/BLUE SHIELD

## 2015-07-28 DIAGNOSIS — Z9889 Other specified postprocedural states: Secondary | ICD-10-CM

## 2015-07-28 DIAGNOSIS — M79672 Pain in left foot: Secondary | ICD-10-CM

## 2015-07-28 NOTE — Progress Notes (Signed)
Patient ID: Buck MamShaun Arbaugh, male   DOB: 08/20/1965, 50 y.o.   MRN: [id number] Subjective: Buck MamShaun Arbaugh is a 50 y.o. male patient seen today in office for POV #1 (DOS 07-19-15), S/P Left EPF with heel spur resection. Patient denies pain at surgical site, denies calf pain, denies headache, chest pain, shortness of breath, nausea, vomiting, fever, or chills. Patient states that he is doing well and is only taking pain med as needed. Has re-started Eliquis with no problem. Reports that he cut off his dressing the night of surgery because his toes were turning purple and covered incisions with bandaids. No other issues noted.   Patient Active Problem List   Diagnosis Date Noted  . 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 Plantar fasciitis 06/07/2015  . Deep vein thrombosis (DVT) of lower extremity (HCC) 12/08/2013  . Post-phlebitic syndrome 11/06/2013  . Heterozygous factor V Leiden mutation (HCC) 08/14/2013  . 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 Chronic venous insufficiency 08/14/2013    Current Outpatient Prescriptions on File Prior to Visit  Medication Sig Dispense Refill  . albuterol (PROVENTIL HFA;VENTOLIN HFA) 108 (90 BASE) MCG/ACT inhaler Inhale 2 puffs into the lungs every 6 (six) hours as needed. For wheezing.    Marland Kitchen. ALPRAZolam (XANAX) 1 MG tablet Take 1 mg by mouth 2 (two) times daily as needed. For anxiety.    Marland Kitchen. aspirin 325 MG tablet Take 325 mg by mouth daily.    . Azelaic Acid (FINACEA) 15 % cream Apply 1 application topically daily as needed. For rosacea.    . cyanocobalamin (,VITAMIN B-12,) 1000 MCG/ML injection Inject 1,000 mcg into the muscle every 30 (thirty) days. Last dose 10/01/2011    . enoxaparin (LOVENOX) 60 MG/0.6ML injection Inject 0.95 mLs (95 mg total) into the skin daily. 5 Syringe 0  . levothyroxine (SYNTHROID, LEVOTHROID) 50 MCG tablet Take 50 mcg by mouth daily.    . Multiple Vitamins-Minerals (MULTIVITAMIN WITH MINERALS) tablet Take 1 tablet by mouth daily.      .  naproxen sodium (ANAPROX) 220 MG tablet Take 220 mg by mouth once as needed. For pain     . testosterone cypionate (DEPOTESTOTERONE CYPIONATE) 100 MG/ML injection Inject 200 mg into the muscle once a week. Inject on Sundays.    Marland Kitchen. thyroid (ARMOUR) 120 MG tablet Take 120 mg by mouth daily.    Marland Kitchen. warfarin (COUMADIN) 5 MG tablet Take 1 tablet (5 mg total) by mouth daily. 10 tablet 0  . zolpidem (AMBIEN) 10 MG tablet Take 10 mg by mouth at bedtime as needed. For sleep       No current facility-administered medications on file prior to visit.    Allergies  Allergen Reactions  . Contrast Media [Iodinated Diagnostic Agents] Hives and Anaphylaxis  . Iodides Anaphylaxis and Hives  . 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 Wheat Bran Diarrhea    Objective: There were no vitals filed for this visit.  General: No acute distress, AAOx3  Left foot: Sutures intact with no gapping or dehiscence at surgical sites at heel, anterior ankle there is mild dry blister with no signs of infection, mild swelling to left heel, no erythema, no warmth, no drainage, no signs of infection noted, Capillary fill time <3 seconds in all digits, gross sensation present via light touch to left foot. No pain or crepitation with range of motion left foot.  No pain with calf compression.   Post Op Xray, Left foot: No acute  structural bony changes. Soft tissue swelling within normal limits for post op status.   Assessment and Plan:  Problem List Items Addressed This Visit    None    Visit Diagnoses    Left foot pain    -  Primary    Relevant Orders    DG Foot Complete Left    Status post left foot surgery        EPF with heel spur resection, 07-19-15       -Patient seen and evaluated -Applied dry sterile dressing to surgical site left foot secured with ACE wrap and stockinet  -Advised patient to make sure to keep dressings clean, dry, and intact to left surgical site, removing the ACE  as needed  -Advised patient to continue with CAM boot on left foot and gentle stretching as tolerated -Advised patient to limit activity to necessity  -Continue with PRN meds as needed -Advised patient to ice and elevate as necessary  -Will plan for suture removal at next office visit and dispensing of night splint. In the meantime, patient to call office if any issues or problems arise.   Asencion Islamitorya Lillian Ballester, DPM

## 2015-08-04 ENCOUNTER — Encounter: Payer: Self-pay | Admitting: Sports Medicine

## 2015-08-04 ENCOUNTER — Ambulatory Visit (INDEPENDENT_AMBULATORY_CARE_PROVIDER_SITE_OTHER): Payer: BLUE CROSS/BLUE SHIELD | Admitting: Sports Medicine

## 2015-08-04 DIAGNOSIS — M79672 Pain in left foot: Secondary | ICD-10-CM

## 2015-08-04 DIAGNOSIS — Z9889 Other specified postprocedural states: Secondary | ICD-10-CM

## 2015-08-04 NOTE — Progress Notes (Signed)
Patient ID: Shane Brown, male   DOB: 03/07/1965, 50 y.o.   MRN: [id number] Subjective: Shane Brown is a 50 y.o. male patient seen today in office for POV #2 (DOS 07-19-15), S/P Left EPF with heel spur resection. Patient denies pain at surgical site, denies calf pain, denies headache, chest pain, shortness of breath, nausea, vomiting, fever, or chills. Patient states that he is doing fine with a little soreness on the heel but nothing like it was before surgery. Denies any other pedal complaints. No other issues noted.   Patient Active Problem List   Diagnosis Date Noted  . 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 Plantar fasciitis 06/07/2015  . Deep vein thrombosis (DVT) of lower extremity (HCC) 12/08/2013  . Post-phlebitic syndrome 11/06/2013  . Heterozygous factor V Leiden mutation (HCC) 08/14/2013  . 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 Chronic venous insufficiency 08/14/2013    Current Outpatient Prescriptions on File Prior to Visit  Medication Sig Dispense Refill  . albuterol (PROVENTIL HFA;VENTOLIN HFA) 108 (90 BASE) MCG/ACT inhaler Inhale 2 puffs into the lungs every 6 (six) hours as needed. For wheezing.    Marland Kitchen. ALPRAZolam (XANAX) 1 MG tablet Take 1 mg by mouth 2 (two) times daily as needed. For anxiety.    Marland Kitchen. aspirin 325 MG tablet Take 325 mg by mouth daily.    . Azelaic Acid (FINACEA) 15 % cream Apply 1 application topically daily as needed. For rosacea.    . cyanocobalamin (,VITAMIN B-12,) 1000 MCG/ML injection Inject 1,000 mcg into the muscle every 30 (thirty) days. Last dose 10/01/2011    . enoxaparin (LOVENOX) 60 MG/0.6ML injection Inject 0.95 mLs (95 mg total) into the skin daily. 5 Syringe 0  . levothyroxine (SYNTHROID, LEVOTHROID) 50 MCG tablet Take 50 mcg by mouth daily.    . Multiple Vitamins-Minerals (MULTIVITAMIN WITH MINERALS) tablet Take 1 tablet by mouth daily.      . naproxen sodium (ANAPROX) 220 MG tablet Take 220 mg by mouth once as needed. For pain     .  testosterone cypionate (DEPOTESTOTERONE CYPIONATE) 100 MG/ML injection Inject 200 mg into the muscle once a week. Inject on Sundays.    Marland Kitchen. thyroid (ARMOUR) 120 MG tablet Take 120 mg by mouth daily.    Marland Kitchen. warfarin (COUMADIN) 5 MG tablet Take 1 tablet (5 mg total) by mouth daily. 10 tablet 0  . zolpidem (AMBIEN) 10 MG tablet Take 10 mg by mouth at bedtime as needed. For sleep       No current facility-administered medications on file prior to visit.    Allergies  Allergen Reactions  . Contrast Media [Iodinated Diagnostic Agents] Hives and Anaphylaxis  . Iodides Anaphylaxis and Hives  . 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 Wheat Bran Diarrhea    Objective: There were no vitals filed for this visit.  General: No acute distress, AAOx3  Left foot: Sutures intact with no gapping or dehiscence at surgical sites at heel, anterior ankle/foot there is mild dry blister with no signs of infection, mild swelling to left heel, no erythema, no warmth, no drainage, no signs of infection noted, Capillary fill time <3 seconds in all digits, gross sensation present via light touch to left foot. No pain or crepitation with range of motion left foot.  No pain with calf compression.    Assessment and Plan:  Problem List Items Addressed This Visit    None    Visit Diagnoses    Status  post left foot surgery    -  Primary    Left foot pain           -Patient seen and evaluated -Sutures removed, Applied dry sterile dressing to surgical site left foot -Patient may shower as normal -Advised patient to slowly transition from CAM boot to normal shoe on left foot as instructed in slow increments -Patient to continue with gentle stretching as tolerated and encouraged gentle massage of incisions and home PT -Advised patient to limit activity to necessity  -Continue with PRN meds as needed -Advised patient to ice and elevate as necessary  -Patient may return to work on  08-16-15 with no restrictions  -Patient to return to office in 2 weeks for follow up eval/ will dispense consider night splint if needed. In the meantime, patient to call office if any issues or problems arise.   Asencion Islamitorya Chun Sellen, DPM

## 2015-08-06 ENCOUNTER — Telehealth: Payer: Self-pay

## 2015-08-06 NOTE — Telephone Encounter (Signed)
LVM regarding pt previous message he left on the nurse line stating he believed his incision was opening. VM included information on severity of incisiona lopening, s/s of infection, advised to bandage area and pull bandage tight to help support the incisional area with ace bandage for support

## 2015-08-18 ENCOUNTER — Ambulatory Visit (INDEPENDENT_AMBULATORY_CARE_PROVIDER_SITE_OTHER): Payer: BLUE CROSS/BLUE SHIELD | Admitting: Sports Medicine

## 2015-08-18 ENCOUNTER — Encounter: Payer: Self-pay | Admitting: Sports Medicine

## 2015-08-18 DIAGNOSIS — Z9889 Other specified postprocedural states: Secondary | ICD-10-CM

## 2015-08-18 NOTE — Progress Notes (Signed)
Patient ID: Shane Brown, male   DOB: 04/13/1965, 50 y.o.   MRN: [id number]  Subjective: Shane Brown is a 50 y.o. male patient seen today in office for POV #3 (DOS 07-19-15), S/P Left EPF with heel spur resection. Patient denies pain at surgical site, states that he gets a little bit of tenderness but nothing like it was; feeling better in sandals, denies calf pain, denies headache, chest pain, shortness of breath, nausea, vomiting, fever, or chills. Denies any other pedal complaints. No other issues noted.   Admits that his husband's mom passed away this week.   Patient Active Problem List   Diagnosis Date Noted  . 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 Plantar fasciitis 06/07/2015  . Deep vein thrombosis (DVT) of lower extremity (HCC) 12/08/2013  . Post-phlebitic syndrome 11/06/2013  . Heterozygous factor V Leiden mutation (HCC) 08/14/2013  . 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 Chronic venous insufficiency 08/14/2013    Current Outpatient Prescriptions on File Prior to Visit  Medication Sig Dispense Refill  . albuterol (PROVENTIL HFA;VENTOLIN HFA) 108 (90 BASE) MCG/ACT inhaler Inhale 2 puffs into the lungs every 6 (six) hours as needed. For wheezing.    Marland Kitchen. ALPRAZolam (XANAX) 1 MG tablet Take 1 mg by mouth 2 (two) times daily as needed. For anxiety.    Marland Kitchen. aspirin 325 MG tablet Take 325 mg by mouth daily.    . Azelaic Acid (FINACEA) 15 % cream Apply 1 application topically daily as needed. For rosacea.    . cyanocobalamin (,VITAMIN B-12,) 1000 MCG/ML injection Inject 1,000 mcg into the muscle every 30 (thirty) days. Last dose 10/01/2011    . enoxaparin (LOVENOX) 60 MG/0.6ML injection Inject 0.95 mLs (95 mg total) into the skin daily. 5 Syringe 0  . levothyroxine (SYNTHROID, LEVOTHROID) 50 MCG tablet Take 50 mcg by mouth daily.    . Multiple Vitamins-Minerals (MULTIVITAMIN WITH MINERALS) tablet Take 1 tablet by mouth daily.      . naproxen sodium (ANAPROX) 220 MG tablet  Take 220 mg by mouth once as needed. For pain     . testosterone cypionate (DEPOTESTOTERONE CYPIONATE) 100 MG/ML injection Inject 200 mg into the muscle once a week. Inject on Sundays.    Marland Kitchen. thyroid (ARMOUR) 120 MG tablet Take 120 mg by mouth daily.    Marland Kitchen. warfarin (COUMADIN) 5 MG tablet Take 1 tablet (5 mg total) by mouth daily. 10 tablet 0  . zolpidem (AMBIEN) 10 MG tablet Take 10 mg by mouth at bedtime as needed. For sleep       No current facility-administered medications on file prior to visit.    Allergies  Allergen Reactions  . Contrast Media [Iodinated Diagnostic Agents] Hives and Anaphylaxis  . Iodides Anaphylaxis and Hives  . 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 Wheat Bran Diarrhea    Objective: There were no vitals filed for this visit.  General: No acute distress, AAOx3  Left foot: Incisions healing well with no gapping or dehiscence at surgical sites at heel, anterior ankle/foot there is dry scab with no signs of infection, minimal swelling to left heel, no erythema, no warmth, no drainage, no signs of infection noted, Capillary fill time <3 seconds in all digits, gross sensation present via light touch to left foot. No pain or crepitation with range of motion left foot.  No pain with calf compression.    Assessment and Plan:  Problem List Items Addressed This Visit  None    Visit Diagnoses    Status post left foot surgery    -  Primary    EPF Left 07-19-15       -Patient seen and evaluated -Left heel surgical sites healing well; dressings no longer needed -Recommend good supportive shoes daily -Recommend continue with CAM boot at night as a night splint to keep soft tissues stretched out as continues to heal -Patient to continue with gentle stretching as tolerated and encouraged gentle massage of incisions and home PT -Continue with PRN meds as needed -Advised patient to ice and elevate as necessary  -Recommend activities to  tolerance -Patient to return to office in 4 weeks for follow up eval/POV. In the meantime, patient to call office if any issues or problems arise.   Shane Brown, DPM

## 2015-09-05 ENCOUNTER — Emergency Department (HOSPITAL_BASED_OUTPATIENT_CLINIC_OR_DEPARTMENT_OTHER)
Admission: EM | Admit: 2015-09-05 | Discharge: 2015-09-05 | Disposition: A | Discharge status: Home / Self Care | Payer: BLUE CROSS/BLUE SHIELD | Attending: Emergency Medicine | Admitting: Emergency Medicine

## 2015-09-05 ENCOUNTER — Encounter (HOSPITAL_BASED_OUTPATIENT_CLINIC_OR_DEPARTMENT_OTHER): Payer: Self-pay | Admitting: *Deleted

## 2015-09-05 DIAGNOSIS — Z7982 Long term (current) use of aspirin: Secondary | ICD-10-CM | POA: Diagnosis not present

## 2015-09-05 DIAGNOSIS — R369 Urethral discharge, unspecified: Secondary | ICD-10-CM | POA: Diagnosis present

## 2015-09-05 DIAGNOSIS — N4822 Cellulitis of corpus cavernosum and penis: Secondary | ICD-10-CM | POA: Diagnosis not present

## 2015-09-05 DIAGNOSIS — Z792 Long term (current) use of antibiotics: Secondary | ICD-10-CM | POA: Diagnosis not present

## 2015-09-05 DIAGNOSIS — Z79899 Other long term (current) drug therapy: Secondary | ICD-10-CM | POA: Diagnosis not present

## 2015-09-05 MED ORDER — SULFAMETHOXAZOLE-TRIMETHOPRIM 800-160 MG PO TABS: 1.0000 | ORAL_TABLET | Freq: Two times a day (BID) | ORAL | 0 refills | Status: AC

## 2015-09-05 NOTE — ED Notes (Signed)
MD at bedside. 

## 2015-09-05 NOTE — ED Triage Notes (Signed)
Pt c/o wound infection to penis piercing.   Also request STD

## 2015-09-05 NOTE — Discharge Instructions (Signed)
Make sure to keep the wound clean and dry. Wash daily with soap and water. Take all antibiotics as prescribed. Follow-up with your urologist if your symptoms are not improving or return here as needed for any worsening symptoms.
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
## 2015-09-06 LAB — GC/CHLAMYDIA PROBE AMP (~~LOC~~) NOT AT ARMC
CHLAMYDIA, DNA PROBE: NEGATIVE
NEISSERIA GONORRHEA: POSITIVE — AB

## 2015-09-07 ENCOUNTER — Telehealth (HOSPITAL_BASED_OUTPATIENT_CLINIC_OR_DEPARTMENT_OTHER): Payer: Self-pay | Admitting: Emergency Medicine

## 2015-09-07 NOTE — Telephone Encounter (Signed)
Chart handoff to EDP for treatment plan for +GC 

## 2015-09-08 ENCOUNTER — Telehealth (HOSPITAL_BASED_OUTPATIENT_CLINIC_OR_DEPARTMENT_OTHER): Payer: Self-pay | Admitting: *Deleted

## 2015-09-08 NOTE — Telephone Encounter (Signed)
Patient positive for gonorrhea and instructed to go to urgent care or health department or ER to get Rocephin IM injection x1 prescribed by EDP. Verbalized understanding.

## 2015-09-09 ENCOUNTER — Emergency Department (HOSPITAL_BASED_OUTPATIENT_CLINIC_OR_DEPARTMENT_OTHER)
Admission: EM | Admit: 2015-09-09 | Discharge: 2015-09-09 | Disposition: A | Discharge status: Home / Self Care | Payer: BLUE CROSS/BLUE SHIELD | Attending: Emergency Medicine | Admitting: Emergency Medicine

## 2015-09-09 ENCOUNTER — Encounter (HOSPITAL_BASED_OUTPATIENT_CLINIC_OR_DEPARTMENT_OTHER): Payer: Self-pay | Admitting: *Deleted

## 2015-09-09 DIAGNOSIS — A549 Gonococcal infection, unspecified: Secondary | ICD-10-CM | POA: Diagnosis present

## 2015-09-09 DIAGNOSIS — Z79899 Other long term (current) drug therapy: Secondary | ICD-10-CM | POA: Diagnosis not present

## 2015-09-09 DIAGNOSIS — Z7982 Long term (current) use of aspirin: Secondary | ICD-10-CM | POA: Diagnosis not present

## 2015-09-09 HISTORY — DX: Gonococcal infection, unspecified: A54.9

## 2015-09-09 LAB — AEROBIC CULTURE W GRAM STAIN (SUPERFICIAL SPECIMEN)

## 2015-09-09 LAB — AEROBIC CULTURE  (SUPERFICIAL SPECIMEN)

## 2015-09-09 MED ORDER — CEFTRIAXONE SODIUM 250 MG IJ SOLR
250.0000 mg | Freq: Once | INTRAMUSCULAR | Status: AC
  Administered 2015-09-09: 250 mg via INTRAMUSCULAR
  Filled 2015-09-09: qty 250

## 2015-09-09 MED ORDER — LIDOCAINE HCL (PF) 1 % IJ SOLN
5.0000 mL | Freq: Once | INTRAMUSCULAR | Status: AC
  Administered 2015-09-09: 0.9 mL via INTRADERMAL
  Filled 2015-09-09: qty 5

## 2015-09-09 NOTE — ED Triage Notes (Signed)
Pt reports he was instructed to come here to receive a shot of Rocephin d/t positive Gonorrhea test that was done here on Sunday. Pt has been taking Bactrim. Reports penile drainage has stopped. Denies penile redness/swelling. Denies fever, other symptoms.

## 2015-09-09 NOTE — ED Provider Notes (Signed)
[facility]   CSN: [id number] Arrival date & time: 09/09/15  [id number]  First Provider Contact:  First MD Initiated Contact with Patient 09/09/15 0912        History   Chief Complaint Chief Complaint  Patient presents with  . Other    Medical treatment for STD    HPI Tou Roderick Peerbaugh Shane Brown is a 50 y.o. male.  HPI   Patient presents requesting injection of rocephin after positive gonorrhea test.  He was seen recently for infected penile piercing and states this is healing well.  Has been taking bactrim.   Denies any pain, penile or wound discharge, fevers, dysuria.     Past Medical History:  Diagnosis Date  . Clotting disorder (HCC)   . Factor 5 Leiden mutation, heterozygous (HCC)   . Gonorrhea 2017  . Lyme disease   . Renal disorder   . Thyroid disease     Patient Active Problem List   Diagnosis Date Noted  . 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 Plantar fasciitis 06/07/2015  . Deep vein thrombosis (DVT) of lower extremity (HCC) 12/08/2013  . Post-phlebitic syndrome 11/06/2013  . Heterozygous factor V Leiden mutation (HCC) 08/14/2013  . 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 Chronic venous insufficiency 08/14/2013    Past Surgical History:  Procedure Laterality Date  . CIRCUMCISION    . TUNNELED VENOUS CATHETER PLACEMENT         Home Medications    Prior to Admission medications   Medication Sig Start Date End Date Taking? Authorizing Provider  ALPRAZolam Prudy Feeler(XANAX) 1 MG tablet Take 1 mg by mouth 2 (two) times daily as needed. For anxiety.   Yes Historical Provider, MD  Apixaban (ELIQUIS PO) Take 5 mg by mouth 2 (two) times daily.   Yes Historical Provider, MD  aspirin 325 MG tablet Take 325 mg by mouth daily.   Yes Historical Provider, MD  cyanocobalamin (,VITAMIN B-12,) 1000 MCG/ML injection Inject 1,000 mcg into the muscle every 30 (thirty) days. Last dose 10/01/2011   Yes Historical Provider, MD  doxycycline (ADOXA) 100 MG  tablet Take 100 mg by mouth 2 (two) times daily.   Yes Historical Provider, MD  Multiple Vitamins-Minerals (CENTRUM SILVER PO) Take by mouth every morning.   Yes Historical Provider, MD  sulfamethoxazole-trimethoprim (BACTRIM DS,SEPTRA DS) 800-160 MG tablet Take 1 tablet by mouth 2 (two) times daily. 09/05/15 09/12/15 Yes Rolan BuccoMelanie Belfi, MD  testosterone cypionate (DEPOTESTOTERONE CYPIONATE) 100 MG/ML injection Inject 200 mg into the muscle once a week. Inject on Sundays.   Yes Historical Provider, MD  zolpidem (AMBIEN) 10 MG tablet Take 10 mg by mouth at bedtime as needed. For sleep     Yes Historical Provider, MD  oxyCODONE-acetaminophen (PERCOCET/ROXICET) 5-325 MG tablet Take by mouth every 4 (four) hours as needed for severe pain.    Historical Provider, MD    Family History No family history on file.  Social History Social History  Substance Use Topics  . Smoking status: Never Smoker  . Smokeless tobacco: Never Used  . Alcohol use Yes     Comment: occasionally/year     Allergies   Contrast media [iodinated diagnostic agents]; Iodides; Hydromorphone; and Wheat bran   Review of Systems Review of Systems  Constitutional: Negative for chills and fever.  Gastrointestinal: Negative for abdominal pain.  Genitourinary: Negative for discharge, dysuria, frequency, penile pain, penile swelling, scrotal swelling, testicular pain and urgency.  Musculoskeletal: Negative for myalgias.  Skin: Negative for color change.  Allergic/Immunologic: Negative for immunocompromised state.     Physical Exam Updated Vital Signs BP 124/86 (BP Location: Left Arm)   Pulse 77   Temp 97.8 F (36.6 C) (Oral)   Resp 18   Ht 6' (1.829 m)   Wt 113.4 kg   SpO2 97%   BMI 33.91 kg/m   Physical Exam  Constitutional: He appears well-developed and well-nourished. No distress.  HENT:  Head: Normocephalic and atraumatic.  Eyes: Conjunctivae are normal.  Neck: Neck supple.  Cardiovascular: Normal rate.     Pulmonary/Chest: Effort normal.  Neurological: He is alert.  Skin: He is not diaphoretic.  Psychiatric: He has a normal mood and affect. His behavior is normal.  Nursing note and vitals reviewed.    ED Treatments / Results  Labs (all labs ordered are listed, but only abnormal results are displayed) Labs Reviewed - No data to display  EKG  EKG Interpretation None       Radiology No results found.  Procedures Procedures (including critical care time)  Medications Ordered in ED Medications  cefTRIAXone (ROCEPHIN) injection 250 mg (not administered)  lidocaine (PF) (XYLOCAINE) 1 % injection 5 mL (not administered)     Initial Impression / Assessment and Plan / ED Course  I have reviewed the triage vital signs and the nursing notes.  Pertinent labs & imaging results that were available during my care of the patient were reviewed by me and considered in my medical decision making (see chart for details).  Clinical Course    Afebrile, nontoxic patient with request for rocephin injection.  Recently tested positive for gonorrhea.  Has also had wound infection from penis piercing that is healing well, has been taking bactrim for this.  Denies any other symptoms.  Gonorrhea came from one encounter of oral sex with someone he does not know or have contact with any more.  Treated with IM rocephin.  D/C home.  Discussed result, findings, treatment, and follow up  with patient.  Pt given return precautions.  Pt verbalizes understanding and agrees with plan.       Final Clinical Impressions(s) / ED Diagnoses   Final diagnoses:  Gonorrhea    New Prescriptions New Prescriptions   No medications on file     Trixie Dredge, PA-C 09/09/15 1013    Vanetta Mulders, MD 09/09/15 1039

## 2015-09-09 NOTE — Discharge Instructions (Signed)
Read the information below.  You may return to the Emergency Department at any time for worsening condition or any new symptoms that concern you. °

## 2015-09-15 ENCOUNTER — Ambulatory Visit (INDEPENDENT_AMBULATORY_CARE_PROVIDER_SITE_OTHER): Payer: BLUE CROSS/BLUE SHIELD | Admitting: Sports Medicine

## 2015-09-15 DIAGNOSIS — M79672 Pain in left foot: Secondary | ICD-10-CM

## 2015-09-15 DIAGNOSIS — Z9889 Other specified postprocedural states: Secondary | ICD-10-CM

## 2015-09-15 NOTE — Progress Notes (Signed)
Patient ID: [name], male   DOB: 06/19/1965, 50 y.o.   MRN: [id number]  Subjective: Florencio Rosalene Billingsrbaugh Morales is a 50 y.o. male patient seen today in office for POV #4 (DOS 07-19-15), S/P Left EPF with heel spur resection. Patient denies pain at surgical site, states that he is able to do any activities especially run without pain, denies calf pain, denies headache, chest pain, shortness of breath, nausea, vomiting, fever, or chills. Denies any other pedal complaints. No other issues noted.   Admits that he will be traveling to see his husband who is out of the country dealing with [place] matters after passing of Mom.   Patient Active Problem List   Diagnosis Date Noted  . 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 Plantar fasciitis 06/07/2015  . Deep vein thrombosis (DVT) of lower extremity (HCC) 12/08/2013  . Post-phlebitic syndrome 11/06/2013  . Heterozygous factor V Leiden mutation (HCC) 08/14/2013  . 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 Chronic venous insufficiency 08/14/2013    Current Outpatient Prescriptions on File Prior to Visit  Medication Sig Dispense Refill  . ALPRAZolam (XANAX) 1 MG tablet Take 1 mg by mouth 2 (two) times daily as needed. For anxiety.    Marland Kitchen. Apixaban (ELIQUIS PO) Take 5 mg by mouth 2 (two) times daily.    Marland Kitchen. aspirin 325 MG tablet Take 325 mg by mouth daily.    . cyanocobalamin (,VITAMIN B-12,) 1000 MCG/ML injection Inject 1,000 mcg into the muscle every 30 (thirty) days. Last dose 10/01/2011    . doxycycline (ADOXA) 100 MG tablet Take 100 mg by mouth 2 (two) times daily.    . Multiple Vitamins-Minerals (CENTRUM SILVER PO) Take by mouth every morning.    Marland Kitchen. oxyCODONE-acetaminophen (PERCOCET/ROXICET) 5-325 MG tablet Take by mouth every 4 (four) hours as needed for severe pain.    Marland Kitchen. testosterone cypionate (DEPOTESTOTERONE CYPIONATE) 100 MG/ML injection Inject 200 mg into the muscle once a week. Inject on Sundays.    Marland Kitchen. zolpidem (AMBIEN) 10 MG tablet Take 10 mg  by mouth at bedtime as needed. For sleep       No current facility-administered medications on file prior to visit.     Allergies  Allergen Reactions  . Contrast Media [Iodinated Diagnostic Agents] Hives and Anaphylaxis  . Iodides Anaphylaxis and Hives  . 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 Wheat Bran Diarrhea    Objective: There were no vitals filed for this visit.  General: No acute distress, AAOx3  Left foot: Incisions well healed surgical sites at heel, anterior ankle/foot there is epithelize skin/healed wound with no signs of infection, minimal swelling to left heel, no erythema, no warmth, no drainage, no signs of infection noted, Capillary fill time <3 seconds in all digits, gross sensation present via light touch to left foot. No pain or crepitation with range of motion left foot.  No pain with calf compression.    Assessment and Plan:  Problem List Items Addressed This Visit    None    Visit Diagnoses    Status post left foot surgery    -  Primary   well healed   Left foot pain       Resolved      -Patient seen and evaluated -Left surgical foot is healed -Continue with activities as tolerated  -Advised patient to ice and elevate as necessary  -Patient to return to office as needed. In the meantime, patient to call office  if any issues or problems arise.   Landis Martins, DPM

## 2015-10-28 NOTE — Progress Notes (Signed)
DOS 06.19.2017 Left Endoscopic Plantar Fascial Release

## 2021-05-09 ENCOUNTER — Emergency Department (HOSPITAL_BASED_OUTPATIENT_CLINIC_OR_DEPARTMENT_OTHER)
Admission: EM | Admit: 2021-05-09 | Discharge: 2021-05-09 | Disposition: A | Discharge status: Home / Self Care | Payer: BC Managed Care – PPO | Attending: Emergency Medicine | Admitting: Emergency Medicine

## 2021-05-09 ENCOUNTER — Emergency Department (HOSPITAL_BASED_OUTPATIENT_CLINIC_OR_DEPARTMENT_OTHER): Payer: BC Managed Care – PPO

## 2021-05-09 ENCOUNTER — Inpatient Hospital Stay: Payer: BC Managed Care – PPO | Attending: Hematology & Oncology | Admitting: Hematology & Oncology

## 2021-05-09 ENCOUNTER — Encounter: Payer: Self-pay | Admitting: Hematology & Oncology

## 2021-05-09 ENCOUNTER — Encounter (HOSPITAL_BASED_OUTPATIENT_CLINIC_OR_DEPARTMENT_OTHER): Payer: Self-pay | Admitting: Emergency Medicine

## 2021-05-09 ENCOUNTER — Other Ambulatory Visit: Payer: Self-pay

## 2021-05-09 VITALS — BP 133/79 | HR 76 | Temp 98.3°F | Resp 18 | Wt 271.0 lb

## 2021-05-09 DIAGNOSIS — Z7982 Long term (current) use of aspirin: Secondary | ICD-10-CM | POA: Diagnosis not present

## 2021-05-09 DIAGNOSIS — R600 Localized edema: Secondary | ICD-10-CM | POA: Insufficient documentation

## 2021-05-09 DIAGNOSIS — M79605 Pain in left leg: Secondary | ICD-10-CM | POA: Diagnosis present

## 2021-05-09 DIAGNOSIS — I82412 Acute embolism and thrombosis of left femoral vein: Secondary | ICD-10-CM

## 2021-05-09 DIAGNOSIS — I82401 Acute embolism and thrombosis of unspecified deep veins of right lower extremity: Secondary | ICD-10-CM

## 2021-05-09 DIAGNOSIS — D6851 Activated protein C resistance: Secondary | ICD-10-CM

## 2021-05-09 HISTORY — DX: Acute embolism and thrombosis of unspecified deep veins of unspecified lower extremity: I82.409

## 2021-05-09 HISTORY — DX: Type 2 diabetes mellitus without complications: E11.9

## 2021-05-09 LAB — BASIC METABOLIC PANEL
Anion gap: 15 (ref 5–15)
BUN: 18 mg/dL (ref 6–20)
CO2: 19 mmol/L — ABNORMAL LOW (ref 22–32)
Calcium: 9.5 mg/dL (ref 8.9–10.3)
Chloride: 102 mmol/L (ref 98–111)
Creatinine, Ser: 1.88 mg/dL — ABNORMAL HIGH (ref 0.61–1.24)
GFR, Estimated: 42 mL/min — ABNORMAL LOW (ref >60)
Glucose, Bld: 92 mg/dL (ref 70–99)
Potassium: 3.7 mmol/L (ref 3.5–5.1)
Sodium: 136 mmol/L (ref 135–145)

## 2021-05-09 LAB — CBC WITH DIFFERENTIAL/PLATELET
Abs Immature Granulocytes: 0.02 10*3/uL (ref 0.00–0.07)
Basophils Absolute: 0.1 10*3/uL (ref 0.0–0.1)
Basophils Relative: 2 %
Eosinophils Absolute: 0.2 10*3/uL (ref 0.0–0.5)
Eosinophils Relative: 4 %
HCT: 51.9 % (ref 39.0–52.0)
Hemoglobin: 18.7 g/dL — ABNORMAL HIGH (ref 13.0–17.0)
Immature Granulocytes: 0 %
Lymphocytes Relative: 28 %
Lymphs Abs: 1.7 10*3/uL (ref 0.7–4.0)
MCH: 34.7 pg — ABNORMAL HIGH (ref 26.0–34.0)
MCHC: 36 g/dL (ref 30.0–36.0)
MCV: 96.3 fL (ref 80.0–100.0)
Monocytes Absolute: 0.8 10*3/uL (ref 0.1–1.0)
Monocytes Relative: 12 %
Neutro Abs: 3.4 10*3/uL (ref 1.7–7.7)
Neutrophils Relative %: 54 %
Platelets: 247 10*3/uL (ref 150–400)
RBC: 5.39 MIL/uL (ref 4.22–5.81)
RDW: 12.1 % (ref 11.5–15.5)
WBC: 6.3 10*3/uL (ref 4.0–10.5)
nRBC: 0 % (ref 0.0–0.2)

## 2021-05-09 LAB — CBG MONITORING, ED: Glucose-Capillary: 103 mg/dL — ABNORMAL HIGH (ref 70–99)

## 2021-05-09 MED ORDER — SODIUM CHLORIDE 0.9 % IV BOLUS
1000.0000 mL | Freq: Once | INTRAVENOUS | Status: AC
  Administered 2021-05-09: 1000 mL via INTRAVENOUS

## 2021-05-09 MED ORDER — DABIGATRAN ETEXILATE MESYLATE 150 MG PO CAPS
150.0000 mg | ORAL_CAPSULE | Freq: Two times a day (BID) | ORAL | 12 refills | Status: DC
Start: 2021-05-09 — End: 2021-05-10

## 2021-05-09 NOTE — ED Provider Notes (Signed)
?[facility] EMERGENCY DEPARTMENT ?Provider Note ? ? ?CSN: [id number] ?Arrival date & time: 05/09/21  [id number] ? ?  ? ?History ? ?Chief Complaint  ?Patient presents with  ? High Ketones  ? Leg Pain  ? ? ?Gifford Roderick Peerbaugh Mariah MillingMorales is a 56 y.o. male. ? ?56 yo M with a chief complaints of feeling lightheaded.  The patient apparently had checked his ketone level and was noted to be high and so decided to come here.  He tells me that he has been fasting for Ramadan.  Is a borderline diabetic that is trying to control his blood sugars through a keto diet.  He also has a history of factor V Leiden disease and has been having some worsening right leg pain.  This been going on for about a week or so.  He had called his vascular surgeon who suggested that he be evaluated for a DVT. ? ? ?Leg Pain ? ?  ? ?Home Medications ?Prior to Admission medications   ?Medication Sig Start Date End Date Taking? Authorizing Provider  ?ALPRAZolam (XANAX) 1 MG tablet Take 1 mg by mouth 2 (two) times daily as needed. For anxiety.    [provider]  ?Apixaban (ELIQUIS PO) Take 5 mg by mouth 2 (two) times daily.    [provider]  ?aspirin 325 MG tablet Take 325 mg by mouth daily.    [provider]  ?cyanocobalamin (,VITAMIN B-12,) 1000 MCG/ML injection Inject 1,000 mcg into the muscle every 30 (thirty) days. Last dose 10/01/2011    [provider]  ?docusate sodium (COLACE) 100 MG capsule Take 100 mg by mouth daily as needed for mild constipation.    Asencion IslamStover, Titorya, DPM  ?doxycycline (ADOXA) 100 MG tablet Take 100 mg by mouth 2 (two) times daily.    [provider]  ?Multiple Vitamins-Minerals (CENTRUM SILVER PO) Take by mouth every morning.    [provider]  ?oxyCODONE-acetaminophen (PERCOCET) 10-325 MG tablet Take 1 tablet by mouth every 6 (six) hours as needed for pain.    Asencion IslamStover, Titorya, DPM  ?oxyCODONE-acetaminophen (PERCOCET/ROXICET) 5-325 MG tablet Take by mouth every 4 (four)  hours as needed for severe pain.    [provider]  ?promethazine (PHENERGAN) 25 MG tablet Take 25 mg by mouth every 6 (six) hours as needed for nausea or vomiting.    Asencion IslamStover, Titorya, DPM  ?testosterone cypionate (DEPOTESTOTERONE CYPIONATE) 100 MG/ML injection Inject 200 mg into the muscle once a week. Inject on Sundays.    [provider]  ?zolpidem (AMBIEN) 10 MG tablet Take 10 mg by mouth at bedtime as needed. For sleep ?     [provider]  ?   ? ?Allergies    ?Iodides, Iodinated contrast media, Gluten meal, Hydromorphone, and Wheat bran   ? ?Review of Systems   ?Review of Systems ? ?Physical Exam ?Updated Vital Signs ?BP 126/83   Pulse 90   Temp 97.9 ?F (36.6 ?C) (Oral)   Resp 12   Ht 6' (1.829 m)   Wt 119.3 kg   SpO2 94%   BMI 35.67 kg/m?  ?Physical Exam ?Vitals and nursing note reviewed.  ?Constitutional:   ?   Appearance: He is well-developed.  ?HENT:  ?   Head: Normocephalic and atraumatic.  ?Eyes:  ?   Pupils: Pupils are equal, round, and reactive to light.  ?Neck:  ?   Vascular: No JVD.  ?Cardiovascular:  ?   Rate and Rhythm: Normal rate and regular rhythm.  ?  Heart sounds: No murmur heard. ?  No friction rub. No gallop.  ?Pulmonary:  ?   Effort: No respiratory distress.  ?   Breath sounds: No wheezing.  ?Abdominal:  ?   General: There is no distension.  ?   Tenderness: There is no abdominal tenderness. There is no guarding or rebound.  ?Musculoskeletal:     ?   General: Normal range of motion.  ?   Cervical back: Normal range of motion and neck supple.  ?   Comments: Right greater than left lower extremity edema.  ?Skin: ?   Coloration: Skin is not pale.  ?   Findings: No rash.  ?Neurological:  ?   Mental Status: He is alert and oriented to person, place, and time.  ?Psychiatric:     ?   Behavior: Behavior normal.  ? ? ?ED Results / Procedures / Treatments   ?Labs ?(all labs ordered are listed, but only abnormal results are displayed) ?Labs Reviewed  ?CBC WITH  DIFFERENTIAL/PLATELET - Abnormal; Notable for the following components:  ?    Result Value  ? Hemoglobin 18.7 (*)   ? MCH 34.7 (*)   ? All other components within normal limits  ?BASIC METABOLIC PANEL - Abnormal; Notable for the following components:  ? CO2 19 (*)   ? Creatinine, Ser 1.88 (*)   ? GFR, Estimated 42 (*)   ? All other components within normal limits  ?CBG MONITORING, ED - Abnormal; Notable for the following components:  ? Glucose-Capillary 103 (*)   ? All other components within normal limits  ? ? ?EKG ?None ? ?Radiology ?US Venous Img Lower Bilateral (DVT) ? ?Result Date: 05/09/2021 ?CLINICAL DATA:  leg pain EXAM: BILATERAL LOWER EXTREMITY VENOUS DOPPLER ULTRASOUND TECHNIQUE: Gray-scale sonography with graded compression, as well as color Doppler and duplex ultrasound were performed to evaluate the lower extremity deep venous systems from the level of the common femoral vein and including the common femoral, femoral, profunda femoral, popliteal and calf veins including the posterior tibial, peroneal and gastrocnemius veins when visible. The superficial great saphenous vein was also interrogated. Spectral Doppler was utilized to evaluate flow at rest and with distal augmentation maneuvers in the common femoral, femoral and popliteal veins. COMPARISON:  August 10, 2014. FINDINGS: RIGHT LOWER EXTREMITY Common Femoral Vein: No evidence of thrombus. Normal compressibility, respiratory phasicity and response to augmentation. Saphenofemoral Junction: No evidence of thrombus. Normal compressibility and flow on color Doppler imaging. Profunda Femoral Vein: No evidence of thrombus. Normal compressibility and flow on color Doppler imaging. Femoral Vein: No evidence of thrombus. Normal compressibility, respiratory phasicity and response to augmentation. Popliteal Vein: No evidence of thrombus. Normal compressibility, respiratory phasicity and response to augmentation. Calf Veins: No evidence of thrombus. Normal  compressibility and flow on color Doppler imaging. Superficial Great Saphenous Vein: Question clot within the upper thigh region. LEFT LOWER EXTREMITY Common Femoral Vein: No evidence of thrombus. Normal compressibility, respiratory phasicity and response to augmentation. Saphenofemoral Junction: No evidence of thrombus. Normal compressibility and flow on color Doppler imaging. Profunda Femoral Vein: No evidence of thrombus. Normal compressibility and flow on color Doppler imaging. Femoral Vein: Visible thrombus with limited compressibility. Popliteal Vein: No evidence of thrombus. Normal compressibility, respiratory phasicity and response to augmentation. Calf Veins: No evidence of thrombus. Normal compressibility and flow on color Doppler imaging. IMPRESSION: 1. Positive for DVT within the left femoral vein. 2. On the right, question nonocclusive thrombus within the superficial great saphenous vein and saphenofemoral junction, which  may be in part chronic. Findings discussed with Dr. Adela Lank via telephone at 11:11 AM. Electronically Signed   By: Feliberto Harts M.D.   On: 05/09/2021 11:13   ? ?Procedures ?Procedures  ? ? ?Medications Ordered in ED ?Medications  ?sodium chloride 0.9 % bolus 1,000 mL (0 mLs Intravenous Stopped 05/09/21 1024)  ? ? ?ED Course/ Medical Decision Making/ A&P ?  ?                        ?Medical Decision Making ?Amount and/or Complexity of Data Reviewed ?Labs: ordered. ? ? ?56 yo M with a chief complaints of feeling lightheaded after fasting over the weekend.  He is well-appearing and nontoxic.  Is not tachycardic.  He took a home ketone test that was positive.  We will give a bolus of IV fluids check blood work and reassess. ? ?Patient also has a history of factor V Leiden.  He is under the care of the vascular surgeon at [facility].  He has had some worsening right leg pain and swelling though has chronic leg pain and swelling on that side.  Has a history of a stent on my record review.  He is  currently on Eliquis.  Will obtain DVT study. ? ?I was notified that the patient actually is complaining of pain to both legs and would like both legs evaluated for DVT. ? 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

## 2021-05-09 NOTE — ED Notes (Signed)
Called Dr. Alroy Dust McGinigle's office and had her paged to speak with Dr. Adela Lank per his request.  Dr. Adela Lank then advised that he needed to speak with Hematology for this patient.  Paged Dr. Myna Hidalgo @ [phone] ?

## 2021-05-09 NOTE — ED Triage Notes (Signed)
Pt states he has had bilateral leg pain for 3 weeks.  No chest pain or sob.  Pt states he has noticed a rise in ketones since Saturday.  Pt states has been doing keto diet but the level has been over 3 since Saturday.  Pt states he felt groggy this am.   ?

## 2021-05-09 NOTE — ED Notes (Signed)
Repaged Dr. Marin Olp - no response from first page.  [phone] ?

## 2021-05-09 NOTE — Discharge Instructions (Signed)
Shane Brown the oncology office and be seen now.  ?

## 2021-05-09 NOTE — Progress Notes (Signed)
Referral MD ? 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 ?Chief Complaint  ?Patient presents with  ? New Patient (Initial Visit)  ?: Had another blood clot.  I am on Eliquis. ? ?HPI: Shane Brown is a very interesting 56 year old Hispanic male.  His mom is from [place].  His father is from [place].  I believe he was born in [place].  He is actually a respiratory therapist. ? ?He has a history of multiple blood clots.  He has been seen by vascular surgery at [facility].  He had been followed by the internationally known Dr.Moll at [facility] but has not seen him for several years.  Has been on multiple blood thinners.  He has had failures on multiple blood thinners.  He currently is on Eliquis. ? ?He does have an IVC filter in place.  This is a permanent filter. ? ?He has moved back up to [place].  He was down in [place] for several years.  He moved back up to [place].  He has a husband who is disabled. ? ?He unfortunately, has been taking testosterone supplementation. ? ?He went to the emergency room today.  He was having pain in his legs.  He has had mostly of the pain in the left leg.  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  He had a Doppler which was positive for a thrombus in the left femoral vein. ? ?Based on this, he was referred up to the [facility]. ? ?He does have compression stockings on. ? ?He does have some element of neuropathy. ? ?He has had no cough or shortness of breath.  He has had no bleeding.  He has had no change in bowel or bladder habits. ? ?He says he gets the factor V Leiden from his father. ? ?Again, has been followed by the internationally renown blood clotting specialist, Dr. Isaiah Serge who has done an incredible job on him. ? ?I just hate the fact that he has been all these different anticoagulants and yet still has had a blood clot.  In this case, I suppose that the  testosterone could be the trigger. ? ?He does not smoke.  He does not drink.   ? ?He has had no problems with COVID. ? ?There is been no headache. ? ?Overall, I would say his performance status is probably ECOG 1. ? ?Past Medical History:  ?Diagnosis Date  ? Clotting disorder (HCC)   ? Diabetes mellitus without complication (HCC)   ? DVT (deep venous thrombosis) (HCC)   ? Factor 5 Leiden mutation, heterozygous (HCC)   ? Gonorrhea 2017  ? Lyme disease   ? Renal disorder   ? Thyroid disease   ?: ? ? ?Past Surgical History:  ?Procedure Laterality Date  ? CIRCUMCISION    ? TUNNELED VENOUS CATHETER PLACEMENT    ?: ? ? ?Current Outpatient Medications:  ?  amLODipine (NORVASC) 10 MG tablet, Take 1 tablet by mouth daily., Disp: , Rfl:  ?  dabigatran (PRADAXA) 150 MG CAPS capsule, Take 1 capsule (150 mg total) by mouth 2 (two) times daily., Disp: 60 capsule, Rfl: 12 ?  ALPRAZolam (XANAX) 0.5 MG tablet, Take 0.5 mg by mouth 2 (two) times daily., Disp: , Rfl:  ?  ALPRAZolam (XANAX) 1 MG tablet, Take 1 mg by mouth 2 (two) times daily as needed. For anxiety., Disp: , Rfl:  ?  DESCOVY 200-25 MG tablet, Take 1 tablet by mouth  daily., Disp: , Rfl:  ?  doxycycline (ADOXA) 100 MG tablet, Take 100 mg by mouth 2 (two) times daily., Disp: , Rfl:  ?  ELIQUIS 5 MG TABS tablet, Take 5 mg by mouth 2 (two) times daily., Disp: , Rfl:  ?  EUCRISA 2 % OINT, Apply 1 application. topically 2 (two) times daily., Disp: , Rfl:  ?  levocetirizine (XYZAL ALLERGY 24HR) 5 MG tablet, Take 5 mg by mouth daily., Disp: , Rfl:  ?  levothyroxine (SYNTHROID) 150 MCG tablet, Take 150 mcg by mouth every morning., Disp: , Rfl:  ?  methadone (DOLOPHINE) 5 MG tablet, Take 5 mg by mouth daily., Disp: , Rfl:  ?  MOUNJARO 5 MG/0.5ML Pen, Inject 5 mg into the skin once a week., Disp: , Rfl:  ?  Multiple Vitamins-Minerals (CENTRUM SILVER PO), Take by mouth every morning., Disp: , Rfl:  ?  nebivolol (BYSTOLIC) 10 MG tablet, Take 10 mg by mouth daily., Disp: , Rfl:  ?   tadalafil (CIALIS) 10 MG tablet, Take 10 mg by mouth daily as needed., Disp: , Rfl:  ?  testosterone cypionate (DEPOTESTOSTERONE CYPIONATE) 200 MG/ML injection, Inject 200 mg into the muscle once a week., Disp: , Rfl:  ?  zolpidem (AMBIEN) 10 MG tablet, Take 10 mg by mouth at bedtime as needed. For sleep  , Disp: , Rfl: : ? ?: ? ? ?Allergies  ?Allergen Reactions  ? Allopurinol Other (See Comments)  ? Iodides Anaphylaxis and Hives  ? Iodinated Contrast Media Hives, Anaphylaxis and Other (See Comments)  ? Gluten Meal Diarrhea  ? Hydromorphone Other (See Comments)  ?  "does not work" ?"does not work" ?"does not work" ?Other reaction(s): Other (See Comments) ?"does not work" ?"does not work" ?"does not work" ?"does not work" ?"does not work" ?  ? Iodine Other (See Comments)  ? Wheat Bran Diarrhea and Other (See Comments)  ?  Other reaction(s): Unknown  ?: ? ?History reviewed. No pertinent family history.: ? ? ?Social History  ? ?Socioeconomic History  ? Marital status: Married  ?  Spouse name: Not on file  ? Number of children: Not on file  ? Years of education: Not on file  ? Highest education level: Not on file  ?Occupational History  ? Not on file  ?Tobacco Use  ? Smoking status: Never  ? Smokeless tobacco: Never  ?Substance and Sexual Activity  ? Alcohol use: Yes  ?  Comment: occasionally/year  ? Drug use: No  ? Sexual activity: Not on file  ?Other Topics Concern  ? Not on file  ?Social History Narrative  ? Not on file  ? ?Social Determinants of Health  ? ?Financial Resource Strain: Not on file  ?Food Insecurity: Not on file  ?Transportation Needs: Not on file  ?Physical Activity: Not on file  ?Stress: Not on file  ?Social Connections: Not on file  ?Intimate Partner Violence: Not on file  ?: ? ?Review of Systems  ?Constitutional: Negative.   ?HENT: Negative.    ?Eyes: Negative.   ?Respiratory: Negative.    ?Cardiovascular: Negative.  Negative for PND.  ?Gastrointestinal: Negative.   ?Genitourinary: Negative.    ?Musculoskeletal:  Positive for myalgias.  ?Skin: Negative.   ?Neurological: Negative.   ?Endo/Heme/Allergies: Negative.   ?Psychiatric/Behavioral: Negative.    ? ? ?Exam: ? @ ?Physical Exam ?Vitals reviewed.  ?HENT:  ?   Head: Normocephalic and atraumatic.  ?Eyes:  ?   Pupils: Pupils are equal, round, and reactive to light.  ?Cardiovascular:  ?  Rate and Rhythm: Normal rate and regular rhythm.  ?   Heart sounds: Normal heart sounds.  ?Pulmonary:  ?   Effort: Pulmonary effort is normal.  ?   Breath sounds: Normal breath sounds.  ?Abdominal:  ?   General: Bowel sounds are normal.  ?   Palpations: Abdomen is soft.  ?Musculoskeletal:     ?   General: No tenderness or deformity. Normal range of motion.  ?   Cervical back: Normal range of motion.  ?   Comments: Extremities does show some swelling in the legs.  There may be little more swelling in the left leg.  He has decent color in the leg.  I do not palpate any obvious venous cord.  He does have some tenderness to palpation in the left thigh.  He does have adequate pulses in the feet bilaterally.  ?Lymphadenopathy:  ?   Cervical: No cervical adenopathy.  ?Skin: ?   General: Skin is warm and dry.  ?   Findings: No erythema or rash.  ?Neurological:  ?   Mental Status: He is alert and oriented to person, place, and time.  ?Psychiatric:     ?   Behavior: Behavior normal.     ?   Thought Content: Thought content normal.     ?   Judgment: Judgment normal.  ? ? ?Recent Labs  ?  05/09/21 ?2878  ?WBC 6.3  ?HGB 18.7*  ?HCT 51.9  ?PLT 247  ? ? ?Recent Labs  ?  05/09/21 ?6767  ?NA 136  ?K 3.7  ?CL 102  ?CO2 19*  ?GLUCOSE 92  ?BUN 18  ?CREATININE 1.88*  ?CALCIUM 9.5  ? ? ?Blood smear review: None ? ?Pathology: None ? ? ? ?Assessment and Plan: Shane Brown is a very nice 57 year old Hispanic male.  He is homozygous for the factor V Leiden.  He clearly has a very hypercoagulable condition.  He has had blood clots again. ? ?I think what would be reasonable would be to try  him on Pradaxa.  This is a direct thrombin inhibitor that he has not been on before.  This has a different mechanism of action than the Eliquis or the Xarelto. ? ?I think this to be reasonable since this is a or

## 2021-05-10 ENCOUNTER — Other Ambulatory Visit: Payer: Self-pay

## 2021-05-10 MED ORDER — DABIGATRAN ETEXILATE MESYLATE 150 MG PO CAPS
150.0000 mg | ORAL_CAPSULE | Freq: Two times a day (BID) | ORAL | 12 refills | Status: AC
Start: 2021-05-10 — End: ?

## 2021-06-01 ENCOUNTER — Inpatient Hospital Stay: Payer: BC Managed Care – PPO | Attending: Hematology & Oncology

## 2021-06-01 ENCOUNTER — Inpatient Hospital Stay: Payer: BC Managed Care – PPO | Admitting: Hematology & Oncology

## 2021-06-10 ENCOUNTER — Other Ambulatory Visit: Payer: BLUE CROSS/BLUE SHIELD

## 2021-06-10 ENCOUNTER — Encounter: Payer: BLUE CROSS/BLUE SHIELD | Admitting: Hematology and Oncology

## 2021-06-28 NOTE — Progress Notes (Signed)
[facility]  [address] [place],  [place]  [address] [phone]  Clinic Day:  06/29/2021  Referring physician: Galvin Proffer, MD   HISTORY OF PRESENT ILLNESS:  The patient is a 56 y.o. male who I was asked to consult upon for polycythemia.  A CBC in early April 2023 showed an elevated hemoglobin of 18.7.  According to the patient, he ha has been taking testosterone for 5 years.  However, he claims that at the time that his CBC was checked in April, he had been off of testosterone for nearly 3 months.  However, he brings to my attention that he recently restarted weekly testosterone 2 weeks ago.  At the time his elevated hemoglobin was seen, he denies taking any other type of anabolic steroids.  As a pertains to his polycythemia, he denies having headaches, vision changes, or other hyperviscosity related symptoms that could be associated with an increased amount of red cells.  He is not aware of other family members who have similar hematologic issues.  Of note, this gentleman has been diagnosed as being homozygous for the factor V Leiden mutation.  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  He has been on chronic anticoagulation.  Initially, he was on Eliquis, but this was recently switched to Pradaxa as it was thought that he had a DVT in his left leg on Eliquis.  However, as it turns out, he has cellulitis in his left leg, not a DVT.  Nevertheless, the patient remains on Pradaxa.  PAST MEDICAL HISTORY:   Past Medical History:  Diagnosis Date   Clotting disorder (HCC)    Diabetes mellitus without complication (HCC)    DVT (deep venous thrombosis) (HCC)    Factor 5 Leiden mutation, heterozygous (HCC)    GERD (gastroesophageal reflux disease)    Gonorrhea 2017   Hypertension    Lyme disease    Renal disorder    Thyroid disease      PAST SURGICAL HISTORY:   Past Surgical History:  Procedure Laterality Date   CIRCUMCISION     TUNNELED VENOUS CATHETER PLACEMENT      CURRENT MEDICATIONS:   Current Outpatient Medications  Medication Sig Dispense Refill   ALPRAZolam (XANAX) 0.5 MG tablet Take 0.5 mg by mouth 2 (two) times daily.     amLODipine (NORVASC) 10 MG tablet Take 1 tablet by mouth daily.     dabigatran (PRADAXA) 150 MG CAPS capsule Take 1 capsule (150 mg total) by mouth 2 (two) times daily. 60 capsule 12   DESCOVY 200-25 MG tablet Take 1 tablet by mouth daily.     levocetirizine (XYZAL ALLERGY 24HR) 5 MG tablet Take 5 mg by mouth daily.     levothyroxine (SYNTHROID) 150 MCG tablet Take 150 mcg by mouth every morning.     MOUNJARO 5 MG/0.5ML Pen Inject 5 mg into the skin once a week.     Multiple Vitamins-Minerals (CENTRUM SILVER PO) Take by mouth every morning.     tadalafil (CIALIS) 10 MG tablet Take 10 mg by mouth daily as needed.     zolpidem (AMBIEN) 10 MG tablet Take 10 mg by mouth at bedtime as needed. For sleep       No current facility-administered medications for this visit.    ALLERGIES:   Allergies  Allergen Reactions  Allopurinol Other (See Comments)   Iodides Anaphylaxis and Hives   Iodinated Contrast Media Hives, Anaphylaxis and Other (See Comments)   Gluten Meal Diarrhea   Hydromorphone Other (See Comments)    "does not work" "does not work" "does not work" Other reaction(s): Other (See Comments) "does not work" "does not work" "does not work" "does not work" "does not work"    Iodine Other (See Comments)   Wheat Bran Diarrhea and Other (See Comments)    Other reaction(s): Unknown    FAMILY HISTORY:   Family History  Problem Relation Age of Onset   Uterine cancer Mother    Lymphoma Father    Diabetes Sister    Diabetes Sister     SOCIAL HISTORY:  The patient was born in [place], [place].  He currently lives in [place] with his husband of 3 years.  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  He denies a history of smoking or alcohol abuse.  REVIEW OF SYSTEMS:  Review of Systems  Constitutional:  Negative for fatigue, fever and unexpected weight change.  Respiratory:  Negative for chest tightness, cough, hemoptysis and shortness of breath.   Cardiovascular:  Negative for chest pain and palpitations.  Gastrointestinal:  Negative for abdominal distention, abdominal pain, blood in stool, constipation, diarrhea, nausea and vomiting.  Genitourinary:  Negative for dysuria, frequency and hematuria.   Musculoskeletal:  Positive for arthralgias. Negative for back pain and myalgias.  Skin:  Negative for itching and rash.       Cellulitis is improving in his bilateral legs  Neurological:  Negative for dizziness, headaches and light-headedness.  Psychiatric/Behavioral:  Negative for depression and suicidal ideas. The patient is not nervous/anxious.     PHYSICAL EXAM:  Blood pressure 133/81, pulse 88, temperature 97.9 F (36.6 C), resp. rate 16, height 6\' 1"  (1.854 m), weight 247 lb (112 kg), SpO2 97 %. Wt Readings from Last 3 Encounters:  06/29/21 247 lb (112 kg)  05/09/21 263 lb (119.3 kg)  05/09/21 271 lb (122.9 kg)   Body mass index is 32.59 kg/m. Performance status (ECOG): 0 - Asymptomatic Physical Exam Constitutional:      Appearance: Normal appearance. He is not ill-appearing.  HENT:     Mouth/Throat:     Mouth: Mucous membranes are moist.     Pharynx: Oropharynx is clear. No oropharyngeal exudate or posterior oropharyngeal erythema.  Cardiovascular:     Rate and Rhythm: Normal rate and regular rhythm.     Heart sounds: No murmur heard.   No friction rub. No gallop.  Pulmonary:     Effort: Pulmonary effort is normal. No respiratory distress.     Breath sounds: Normal breath sounds. No wheezing, rhonchi or rales.  Abdominal:     General: Bowel sounds are normal. There is no distension.      Palpations: Abdomen is soft. There is no mass.     Tenderness: There is no abdominal tenderness.  Musculoskeletal:        General: No swelling.     Right lower leg: No edema.     Left lower leg: No edema.  Lymphadenopathy:     Cervical: No cervical adenopathy.     Upper Body:     Right upper body: No supraclavicular or axillary adenopathy.     Left upper body: No supraclavicular or axillary adenopathy.     Lower Body: No right inguinal adenopathy. No left inguinal adenopathy.  Skin:  General: Skin is warm.     Coloration: Skin is not jaundiced.     Findings: No lesion or rash.  Neurological:     General: No focal deficit present.     Mental Status: He is alert and oriented to person, place, and time. Mental status is at baseline.  Psychiatric:        Mood and Affect: Mood normal.        Behavior: Behavior normal.        Thought Content: Thought content normal.    LABS:      Latest Ref Rng & Units 06/29/2021   12:00 AM 05/09/2021    8:43 AM 10/27/2011   12:11 PM  CBC  WBC  6.9      6.3   9.6    Hemoglobin 13.5 - 17.5 15.2      18.7   17.6    Hematocrit 41 - 53 46      51.9   48.2    Platelets 150 - 400 K/uL 261      247   337       This result is from an external source.      Latest Ref Rng & Units 05/09/2021    8:43 AM 10/27/2011   12:11 PM 06/17/2010   10:17 AM  CMP  Glucose 70 - 99 mg/dL 92   98   109    BUN 6 - 20 mg/dL 18   11   13     Creatinine 0.61 - 1.24 mg/dL 1.88   1.21   1.48    Sodium 135 - 145 mmol/L 136   138   137    Potassium 3.5 - 5.1 mmol/L 3.7   3.7   4.2    Chloride 98 - 111 mmol/L 102   102   103    CO2 22 - 32 mmol/L 19   25   22     Calcium 8.9 - 10.3 mg/dL 9.5   9.9   9.6    Total Protein 6.0 - 8.3 g/dL  7.4   6.5    Total Bilirubin 0.3 - 1.2 mg/dL  0.5   0.4    Alkaline Phos 39 - 117 U/L  81   53    AST 0 - 37 U/L  20   41    ALT 0 - 53 U/L  17   35      ASSESSMENT & PLAN:  A 56 y.o. male who I was asked to consult upon for likely  secondary polycythemia.  I am pleased as his hemoglobin has returned to a normal level of 15.2.  Overall, I am pleased with his labs today.  In clinic today, we discussed that if he continues to take weekly testosterone injections, his CBC will need to be checked every 3-4 months.  If any of his future CBC's shows an elevated hemoglobin of at least 19, I would recommend a phlebotomy at that time.  As he has no other pressing hematologic issues, I do feel comfortable turning his care back over to his primary care office.  I would not have a problem seeing him in the future if new hematologic issues arise that require repeat clinical assessment.  The patient understands all the plans discussed today and is in agreement with them.  I do appreciate Hague, Rosalyn Charters, MD for his new consult.   Welborn Keena Macarthur Critchley, MD

## 2021-06-29 ENCOUNTER — Other Ambulatory Visit: Payer: Self-pay | Admitting: Oncology

## 2021-06-29 ENCOUNTER — Inpatient Hospital Stay (INDEPENDENT_AMBULATORY_CARE_PROVIDER_SITE_OTHER): Payer: BC Managed Care – PPO | Admitting: Oncology

## 2021-06-29 ENCOUNTER — Encounter: Payer: Self-pay | Admitting: Oncology

## 2021-06-29 ENCOUNTER — Inpatient Hospital Stay: Payer: BC Managed Care – PPO

## 2021-06-29 DIAGNOSIS — D751 Secondary polycythemia: Secondary | ICD-10-CM

## 2021-06-29 LAB — CBC AND DIFFERENTIAL
HCT: 46 (ref 41–53)
Hemoglobin: 15.2 (ref 13.5–17.5)
Neutrophils Absolute: 3.86
Platelets: 261 10*3/uL (ref 150–400)
WBC: 6.9

## 2021-06-29 LAB — CBC: RBC: 4.55 (ref 3.87–5.11)

## 2022-07-06 ENCOUNTER — Other Ambulatory Visit (HOSPITAL_BASED_OUTPATIENT_CLINIC_OR_DEPARTMENT_OTHER): Payer: Self-pay

## 2022-07-06 MED ORDER — MOUNJARO 2.5 MG/0.5ML ~~LOC~~ SOAJ
2.5000 mg | SUBCUTANEOUS | 2 refills | Status: AC
  Filled 2022-07-06: qty 2, 28d supply, fill #0
  Filled 2022-08-10: qty 2, 28d supply, fill #1
  Filled 2022-09-16: qty 2, 28d supply, fill #2

## 2022-07-20 ENCOUNTER — Other Ambulatory Visit (HOSPITAL_BASED_OUTPATIENT_CLINIC_OR_DEPARTMENT_OTHER): Payer: Self-pay

## 2022-07-20 MED ORDER — MOUNJARO 5 MG/0.5ML ~~LOC~~ SOPN
5.0000 mg | PEN_INJECTOR | SUBCUTANEOUS | 2 refills | Status: AC
Start: 2022-07-20 — End: ?
  Filled 2022-07-20: qty 2, 28d supply, fill #0
  Filled 2022-10-19: qty 2, 28d supply, fill #1

## 2022-08-07 ENCOUNTER — Other Ambulatory Visit (HOSPITAL_BASED_OUTPATIENT_CLINIC_OR_DEPARTMENT_OTHER): Payer: Self-pay

## 2022-08-07 MED ORDER — MOUNJARO 7.5 MG/0.5ML ~~LOC~~ SOAJ
7.5000 mg | SUBCUTANEOUS | 2 refills | Status: AC
  Filled 2022-08-07: qty 2, 28d supply, fill #0
  Filled 2022-11-16: qty 2, 28d supply, fill #1

## 2022-09-26 IMAGING — US US EXTREM LOW VENOUS
1 series · 13 of 24 positions shown · non-contrast
Comparison: August 10, 2014.

CLINICAL DATA: leg pain



[Series 1: us extrem low venous · 13 of 84 slices shown]
[im 1/84]
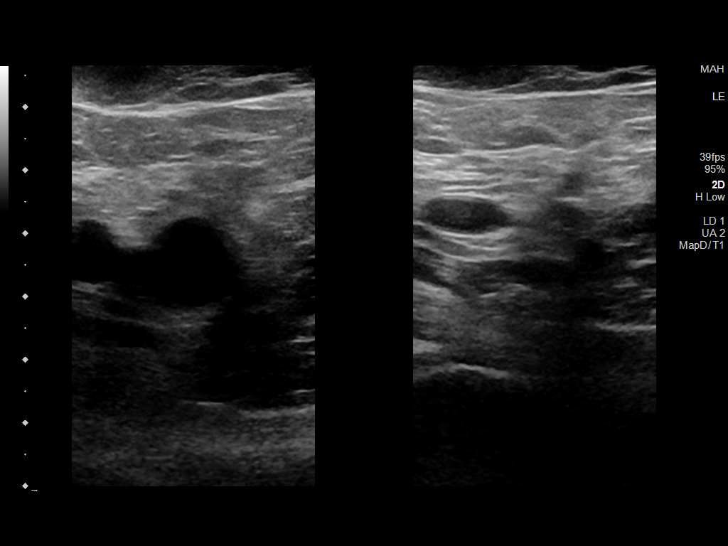
[im 8/84]
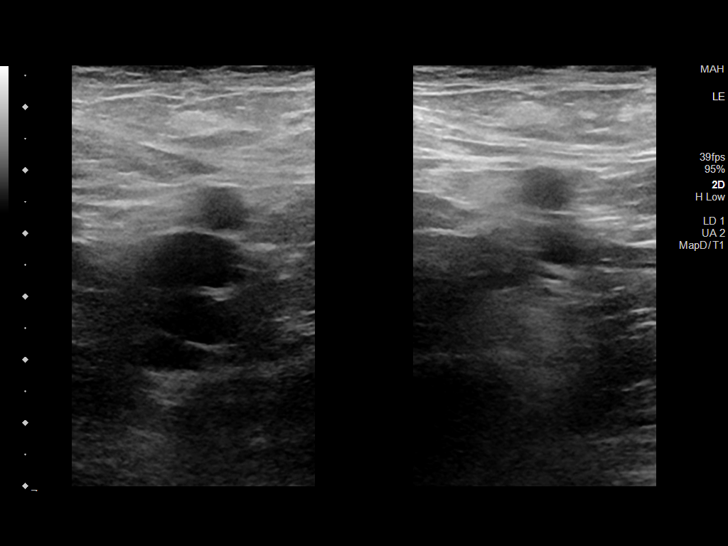
[im 15/84]
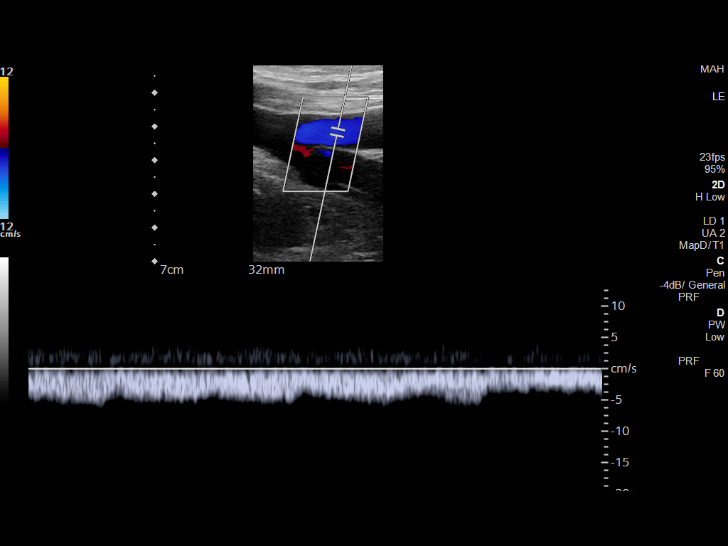
[im 22/84]
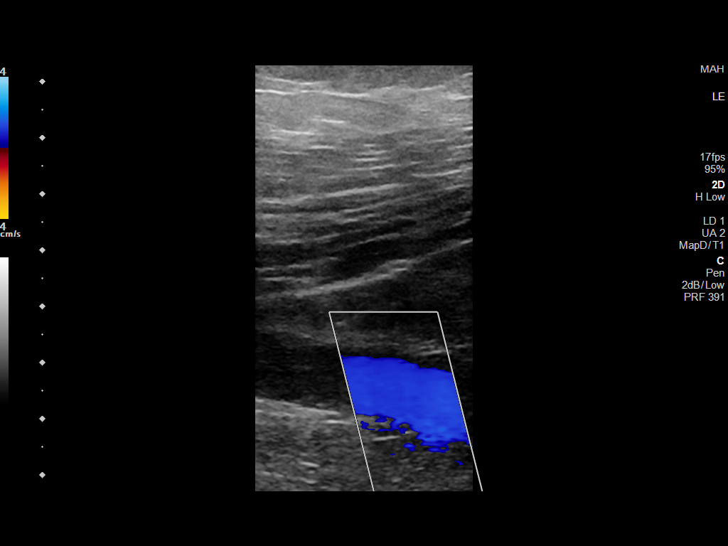
[im 29/84]
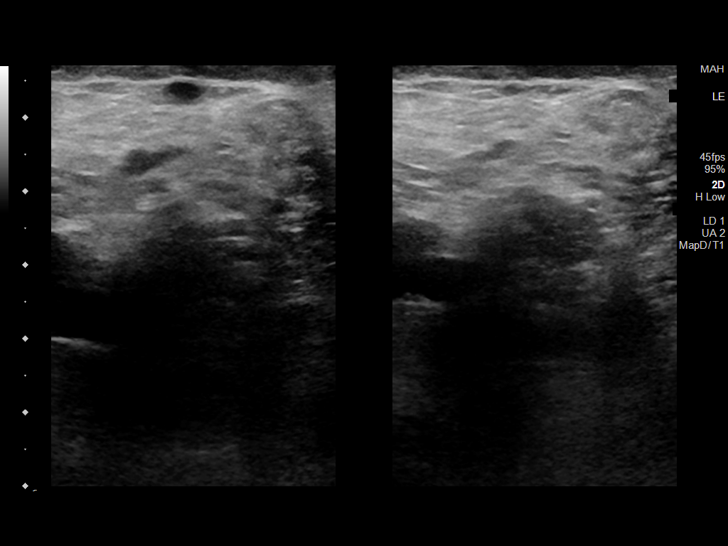
[im 37/84]
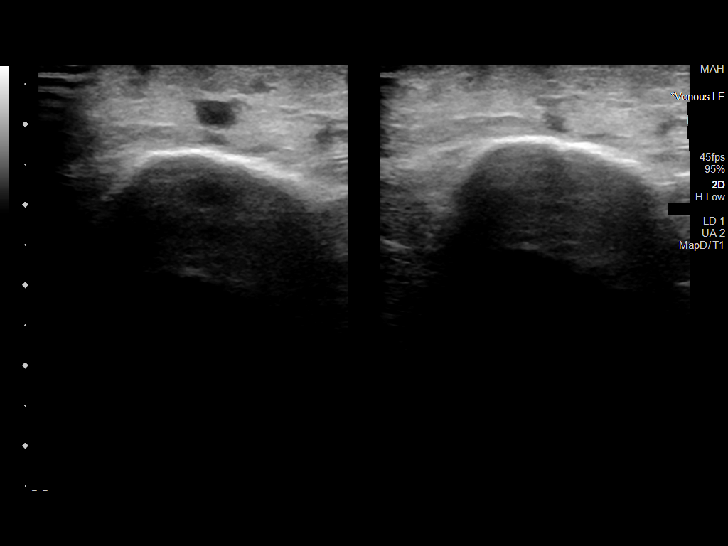
[im 44/84]
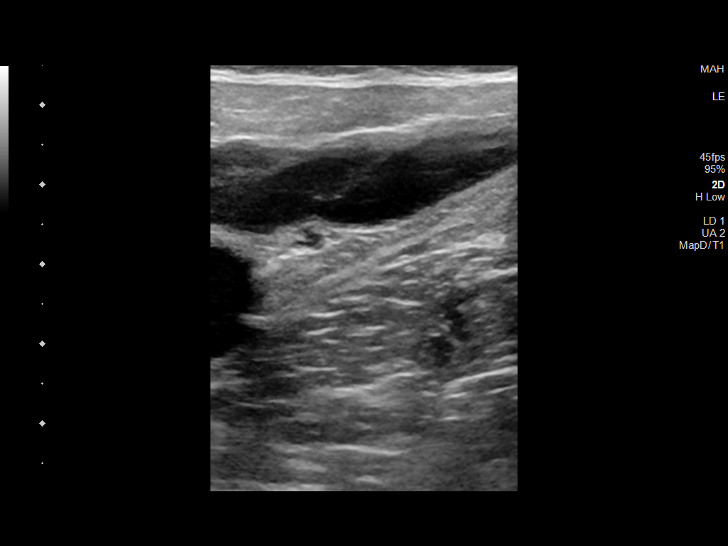
[im 47/84]
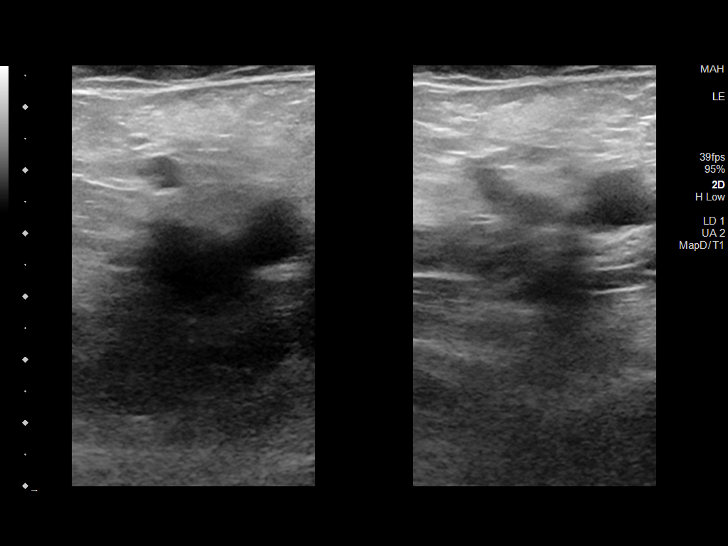
[im 55/84]
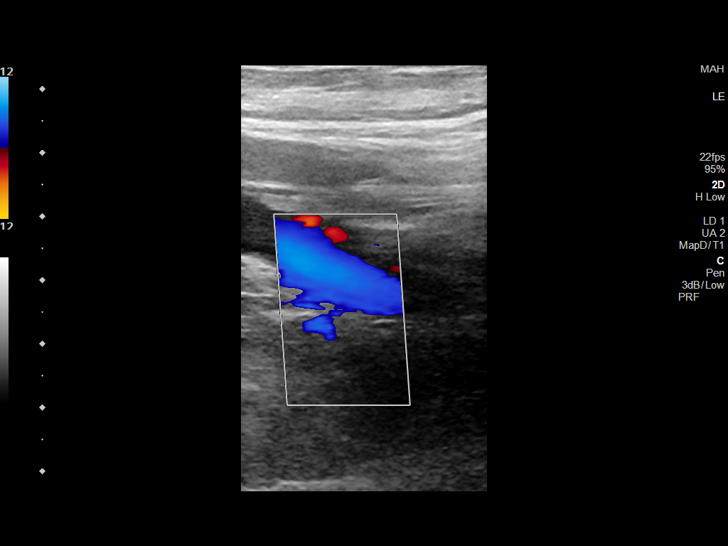
[im 62/84]
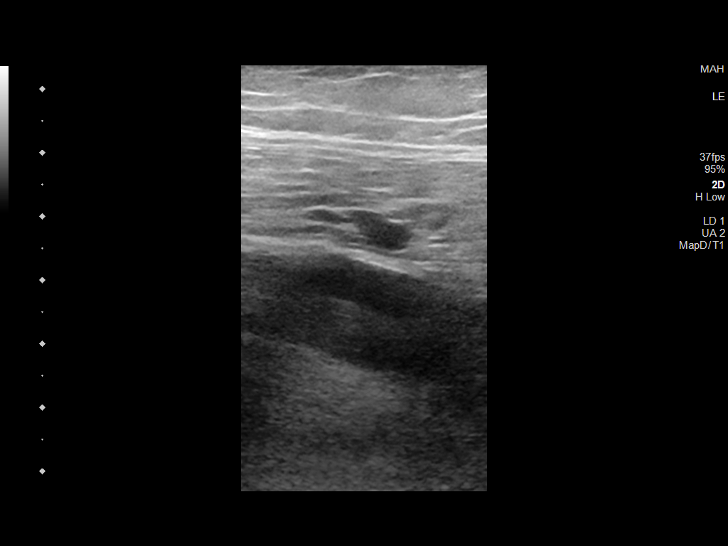
[im 69/84]
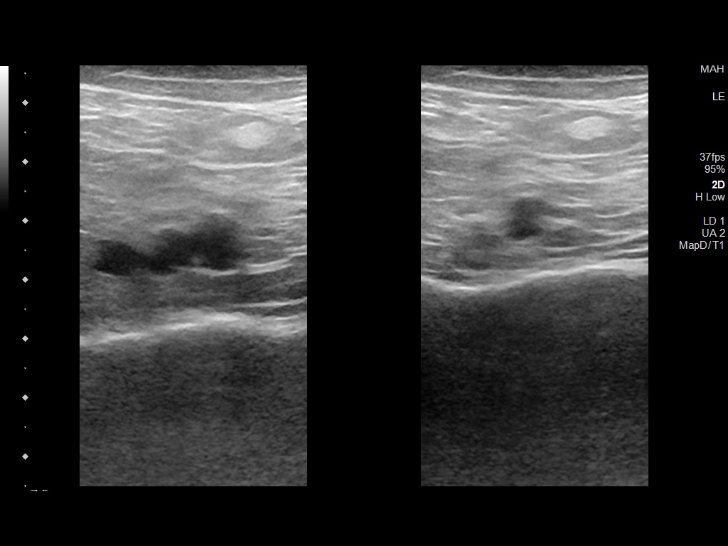
[im 76/84]
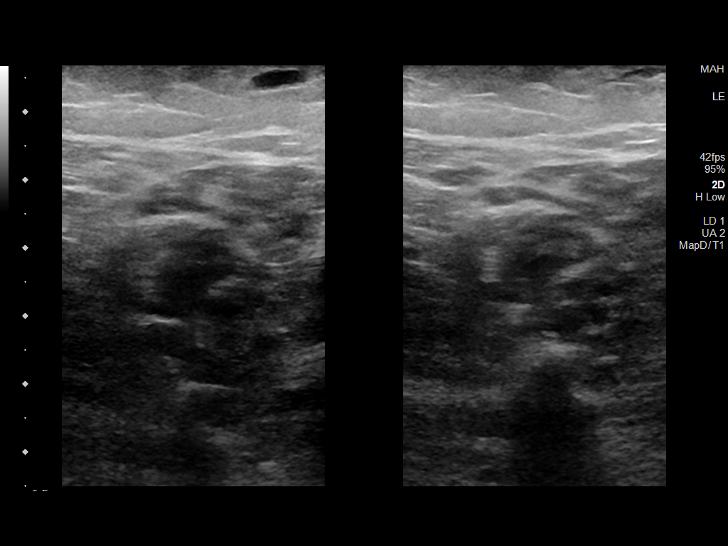
[im 84/84]
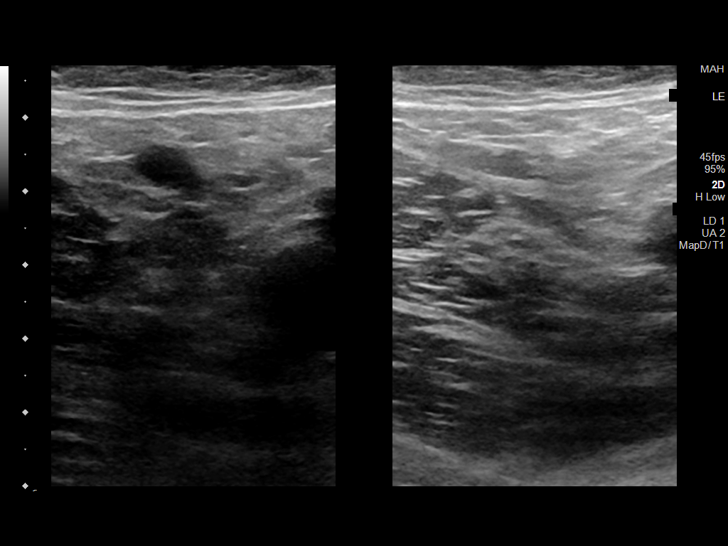

[13 of 24 positions shown; findings below may reference images not displayed]

FINDINGS: RIGHT LOWER EXTREMITY

Common Femoral Vein: No evidence of thrombus. Normal
compressibility, respiratory phasicity and response to augmentation.

Saphenofemoral Junction: No evidence of thrombus. Normal
compressibility and flow on color Doppler imaging.

Profunda Femoral Vein: No evidence of thrombus. Normal
compressibility and flow on color Doppler imaging.

Femoral Vein: No evidence of thrombus. Normal compressibility,
respiratory phasicity and response to augmentation.

Popliteal Vein: No evidence of thrombus. Normal compressibility,
respiratory phasicity and response to augmentation.

Calf Veins: No evidence of thrombus. Normal compressibility and flow
on color Doppler imaging.

Superficial Great Saphenous Vein: Question clot within the upper
thigh region.

LEFT LOWER EXTREMITY

Common Femoral Vein: No evidence of thrombus. Normal
compressibility, respiratory phasicity and response to augmentation.

Saphenofemoral Junction: No evidence of thrombus. Normal
compressibility and flow on color Doppler imaging.

Profunda Femoral Vein: No evidence of thrombus. Normal
compressibility and flow on color Doppler imaging.

Femoral Vein: Visible thrombus with limited compressibility.

Popliteal Vein: No evidence of thrombus. Normal compressibility,
respiratory phasicity and response to augmentation.

Calf Veins: No evidence of thrombus. Normal compressibility and flow
on color Doppler imaging.
IMPRESSION: 1. Positive for DVT within the left femoral vein.
2. On the right, question nonocclusive thrombus within the
superficial great saphenous vein and saphenofemoral junction, which
may be in part chronic.

Findings discussed with Dr. Tiger via telephone at [DATE].

## 2022-11-16 ENCOUNTER — Other Ambulatory Visit (HOSPITAL_BASED_OUTPATIENT_CLINIC_OR_DEPARTMENT_OTHER): Payer: Self-pay

## 2023-01-03 ENCOUNTER — Other Ambulatory Visit (HOSPITAL_COMMUNITY): Payer: Self-pay | Admitting: Nephrology

## 2023-01-03 DIAGNOSIS — R8290 Unspecified abnormal findings in urine: Secondary | ICD-10-CM

## 2023-01-03 DIAGNOSIS — N1832 Chronic kidney disease, stage 3b: Secondary | ICD-10-CM

## 2023-01-17 ENCOUNTER — Encounter (HOSPITAL_COMMUNITY): Payer: Self-pay

## 2023-01-17 ENCOUNTER — Ambulatory Visit (HOSPITAL_COMMUNITY): Payer: Medicaid Other

## 2023-01-19 ENCOUNTER — Ambulatory Visit (HOSPITAL_COMMUNITY)
Admission: RE | Admit: 2023-01-19 | Discharge: 2023-01-19 | Disposition: A | Discharge status: Home / Self Care | Payer: PRIVATE HEALTH INSURANCE | Source: Ambulatory Visit | Attending: Nephrology | Admitting: Nephrology

## 2023-01-19 DIAGNOSIS — R8290 Unspecified abnormal findings in urine: Secondary | ICD-10-CM | POA: Insufficient documentation

## 2023-01-19 DIAGNOSIS — N1832 Chronic kidney disease, stage 3b: Secondary | ICD-10-CM | POA: Insufficient documentation
